# Patient Record
Sex: Male | Born: 1940 | Race: Black or African American | Hispanic: No | Marital: Married | Smoking: Former smoker
Health system: Southern US, Community
[De-identification: ages and names within clinical notes are randomized; demographics above are authoritative.]

## PROBLEM LIST (undated history)

## (undated) DIAGNOSIS — K219 Gastro-esophageal reflux disease without esophagitis: Secondary | ICD-10-CM

## (undated) DIAGNOSIS — M1990 Unspecified osteoarthritis, unspecified site: Secondary | ICD-10-CM

## (undated) DIAGNOSIS — I82409 Acute embolism and thrombosis of unspecified deep veins of unspecified lower extremity: Secondary | ICD-10-CM

## (undated) DIAGNOSIS — C801 Malignant (primary) neoplasm, unspecified: Secondary | ICD-10-CM

## (undated) DIAGNOSIS — I639 Cerebral infarction, unspecified: Secondary | ICD-10-CM

## (undated) DIAGNOSIS — I2510 Atherosclerotic heart disease of native coronary artery without angina pectoris: Secondary | ICD-10-CM

## (undated) DIAGNOSIS — I10 Essential (primary) hypertension: Secondary | ICD-10-CM

## (undated) HISTORY — DX: Gastro-esophageal reflux disease without esophagitis: K21.9

## (undated) HISTORY — DX: Acute embolism and thrombosis of unspecified deep veins of unspecified lower extremity: I82.409

## (undated) HISTORY — PX: KNEE ARTHROSCOPY: SHX127

---

## 2012-12-12 HISTORY — PX: PROSTATE BIOPSY: SHX241

## 2014-02-23 ENCOUNTER — Emergency Department (HOSPITAL_COMMUNITY)
Admission: EM | Admit: 2014-02-23 | Discharge: 2014-02-23 | Disposition: A | Discharge status: Home / Self Care | Payer: MEDICARE | Attending: Emergency Medicine | Admitting: Emergency Medicine

## 2014-02-23 ENCOUNTER — Encounter (HOSPITAL_COMMUNITY): Payer: Self-pay | Admitting: Emergency Medicine

## 2014-02-23 ENCOUNTER — Emergency Department (HOSPITAL_COMMUNITY): Payer: MEDICARE

## 2014-02-23 DIAGNOSIS — I10 Essential (primary) hypertension: Secondary | ICD-10-CM | POA: Insufficient documentation

## 2014-02-23 DIAGNOSIS — R0602 Shortness of breath: Secondary | ICD-10-CM | POA: Insufficient documentation

## 2014-02-23 DIAGNOSIS — M79609 Pain in unspecified limb: Secondary | ICD-10-CM | POA: Insufficient documentation

## 2014-02-23 DIAGNOSIS — I7102 Dissection of abdominal aorta: Secondary | ICD-10-CM

## 2014-02-23 DIAGNOSIS — Z7982 Long term (current) use of aspirin: Secondary | ICD-10-CM | POA: Insufficient documentation

## 2014-02-23 DIAGNOSIS — M129 Arthropathy, unspecified: Secondary | ICD-10-CM | POA: Insufficient documentation

## 2014-02-23 DIAGNOSIS — Z8673 Personal history of transient ischemic attack (TIA), and cerebral infarction without residual deficits: Secondary | ICD-10-CM | POA: Insufficient documentation

## 2014-02-23 DIAGNOSIS — C61 Malignant neoplasm of prostate: Secondary | ICD-10-CM

## 2014-02-23 DIAGNOSIS — Z86718 Personal history of other venous thrombosis and embolism: Secondary | ICD-10-CM | POA: Insufficient documentation

## 2014-02-23 DIAGNOSIS — Z7901 Long term (current) use of anticoagulants: Secondary | ICD-10-CM | POA: Insufficient documentation

## 2014-02-23 DIAGNOSIS — M549 Dorsalgia, unspecified: Secondary | ICD-10-CM

## 2014-02-23 DIAGNOSIS — C786 Secondary malignant neoplasm of retroperitoneum and peritoneum: Secondary | ICD-10-CM | POA: Insufficient documentation

## 2014-02-23 DIAGNOSIS — Z79899 Other long term (current) drug therapy: Secondary | ICD-10-CM | POA: Insufficient documentation

## 2014-02-23 DIAGNOSIS — I2510 Atherosclerotic heart disease of native coronary artery without angina pectoris: Secondary | ICD-10-CM | POA: Insufficient documentation

## 2014-02-23 DIAGNOSIS — Z7902 Long term (current) use of antithrombotics/antiplatelets: Secondary | ICD-10-CM | POA: Insufficient documentation

## 2014-02-23 DIAGNOSIS — R011 Cardiac murmur, unspecified: Secondary | ICD-10-CM | POA: Insufficient documentation

## 2014-02-23 DIAGNOSIS — R52 Pain, unspecified: Secondary | ICD-10-CM

## 2014-02-23 DIAGNOSIS — M79606 Pain in leg, unspecified: Secondary | ICD-10-CM

## 2014-02-23 HISTORY — DX: Atherosclerotic heart disease of native coronary artery without angina pectoris: I25.10

## 2014-02-23 HISTORY — DX: Cerebral infarction, unspecified: I63.9

## 2014-02-23 HISTORY — DX: Essential (primary) hypertension: I10

## 2014-02-23 HISTORY — DX: Malignant (primary) neoplasm, unspecified: C80.1

## 2014-02-23 HISTORY — DX: Unspecified osteoarthritis, unspecified site: M19.90

## 2014-02-23 LAB — CBC WITH DIFFERENTIAL/PLATELET
BASOS PCT: 0 % (ref 0–1)
Basophils Absolute: 0 10*3/uL (ref 0.0–0.1)
EOS ABS: 0.1 10*3/uL (ref 0.0–0.7)
Eosinophils Relative: 1 % (ref 0–5)
HCT: 33.2 % — ABNORMAL LOW (ref 39.0–52.0)
Hemoglobin: 11.1 g/dL — ABNORMAL LOW (ref 13.0–17.0)
LYMPHS ABS: 2 10*3/uL (ref 0.7–4.0)
Lymphocytes Relative: 27 % (ref 12–46)
MCH: 30.5 pg (ref 26.0–34.0)
MCHC: 33.4 g/dL (ref 30.0–36.0)
MCV: 91.2 fL (ref 78.0–100.0)
Monocytes Absolute: 0.5 10*3/uL (ref 0.1–1.0)
Monocytes Relative: 7 % (ref 3–12)
Neutro Abs: 4.7 10*3/uL (ref 1.7–7.7)
Neutrophils Relative %: 64 % (ref 43–77)
Platelets: 252 10*3/uL (ref 150–400)
RBC: 3.64 MIL/uL — ABNORMAL LOW (ref 4.22–5.81)
RDW: 12.9 % (ref 11.5–15.5)
WBC: 7.4 10*3/uL (ref 4.0–10.5)

## 2014-02-23 LAB — URINALYSIS, ROUTINE W REFLEX MICROSCOPIC
BILIRUBIN URINE: NEGATIVE
Glucose, UA: NEGATIVE mg/dL
Hgb urine dipstick: NEGATIVE
Ketones, ur: NEGATIVE mg/dL
Leukocytes, UA: NEGATIVE
Nitrite: NEGATIVE
Protein, ur: NEGATIVE mg/dL
Specific Gravity, Urine: 1.025 (ref 1.005–1.030)
Urobilinogen, UA: 0.2 mg/dL (ref 0.0–1.0)
pH: 6 (ref 5.0–8.0)

## 2014-02-23 LAB — BASIC METABOLIC PANEL
BUN: 22 mg/dL (ref 6–23)
CO2: 27 mEq/L (ref 19–32)
Calcium: 9.5 mg/dL (ref 8.4–10.5)
Chloride: 101 mEq/L (ref 96–112)
Creatinine, Ser: 1.55 mg/dL — ABNORMAL HIGH (ref 0.50–1.35)
GFR, EST AFRICAN AMERICAN: 51 mL/min — AB (ref >90)
GFR, EST NON AFRICAN AMERICAN: 44 mL/min — AB (ref >90)
GLUCOSE: 113 mg/dL — AB (ref 70–99)
POTASSIUM: 4.6 meq/L (ref 3.7–5.3)
SODIUM: 137 meq/L (ref 137–147)

## 2014-02-23 MED ORDER — IOHEXOL 350 MG/ML SOLN
100.0000 mL | Freq: Once | INTRAVENOUS | Status: AC | PRN
  Administered 2014-02-23: 100 mL via INTRAVENOUS

## 2014-02-23 MED ORDER — OXYCODONE-ACETAMINOPHEN 5-325 MG PO TABS: 1.0000 | ORAL_TABLET | Freq: Four times a day (QID) | ORAL | Status: DC | PRN

## 2014-02-23 MED ORDER — ONDANSETRON HCL 4 MG PO TABS
4.0000 mg | ORAL_TABLET | Freq: Once | ORAL | Status: AC
  Administered 2014-02-23: 4 mg via ORAL
  Filled 2014-02-23: qty 1

## 2014-02-23 MED ORDER — OXYCODONE-ACETAMINOPHEN 5-325 MG PO TABS
1.0000 | ORAL_TABLET | Freq: Once | ORAL | Status: AC
  Administered 2014-02-23: 1 via ORAL
  Filled 2014-02-23: qty 1

## 2014-02-23 NOTE — ED Provider Notes (Signed)
CSN: [id number]     Arrival date & time 02/23/14  1251 History   First MD Initiated Contact with Patient 02/23/14 1414     Chief Complaint  Patient presents with  . Back Pain     (Consider location/radiation/quality/duration/timing/severity/associated sxs/prior Treatment) HPI Comments: Patient is a 73 year old male presents to the emergency department with complaint of back pain. Patient states that for the past 2-3 weeks she's been having a pain near his right kidney area that feels like a pinch and then turns into a pain, and then goes down his right leg. The patient also complains of a heavy sensation involving the right leg. The daughter also present and is primarily the historian reports that the patient has had diagnoses of deep vein thrombosis recently. He is placed Eloquist . She states that it seems as though the healing process for his deep vein thrombosis seems to be very slow. It is also of note that the patient has a history of prostate cancer. He is currently being treated for this. 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 There's been no abdominal pain. Initially when the deep vein thrombosis was being diagnosed, there was difficulty with breathing when laying down flat, but the patient states he is not having this problem any longer. There's been no recent blood in the urine no pain in rectum or blood in the stool. His been no unusual weakness. Patient and family present to the emergency department for additional evaluation of this back pain and changes in the right leg.  The history is provided by a relative.    Past Medical History  Diagnosis Date  . Stroke   . Hypertension   . Coronary artery disease   . Arthritis   . Cancer    Past Surgical History  Procedure Laterality Date  . Knee arthroscopy     History reviewed. No pertinent family history. History   Substance Use Topics  . Smoking status: Former Research scientist (life sciences)  . Smokeless tobacco: Not on file  . Alcohol Use: No    Review of Systems  Constitutional: Positive for fatigue. Negative for activity change.       All ROS Neg except as noted in HPI  HENT: Negative for nosebleeds.   Eyes: Negative for photophobia and discharge.  Respiratory: Positive for shortness of breath. Negative for cough and wheezing.   Cardiovascular: Positive for leg swelling. Negative for chest pain and palpitations.  Gastrointestinal: Negative for abdominal pain and blood in stool.  Genitourinary: Negative for dysuria, frequency, hematuria, scrotal swelling, difficulty urinating and testicular pain.  Musculoskeletal: Positive for arthralgias and back pain. Negative for neck pain.  Skin: Negative.   Neurological: Negative for dizziness, seizures, syncope, speech difficulty and headaches.  Psychiatric/Behavioral: Negative for hallucinations and confusion.      Allergies  Claritin  Home Medications   Current Outpatient Rx  Name  Route  Sig  Dispense  Refill  . amLODipine (NORVASC) 10 MG tablet   Oral   Take 10 mg by mouth daily.         Marland Kitchen apixaban (ELIQUIS) 5 MG TABS tablet   Oral   Take 5 mg by mouth 2 (two) times daily.         Marland Kitchen aspirin EC 81 MG tablet   Oral   Take 81 mg by mouth daily.         Marland Kitchen  bicalutamide (CASODEX) 50 MG tablet   Oral   Take 50 mg by mouth daily.         . clopidogrel (PLAVIX) 75 MG tablet   Oral   Take 75 mg by mouth daily with breakfast.         . furosemide (LASIX) 20 MG tablet   Oral   Take 40 mg by mouth daily.         . hydrALAZINE (APRESOLINE) 50 MG tablet   Oral   Take 50 mg by mouth 2 (two) times daily.         Marland Kitchen leuprolide (LUPRON) 11.25 MG injection   Intramuscular   Inject 11.25 mg into the muscle every 6 (six) months.         Marland Kitchen lisinopril (PRINIVIL,ZESTRIL) 40 MG tablet   Oral   Take 40 mg by mouth daily.         . Multiple  Minerals-Vitamins (PROSTEON PO)   Oral   Take 1 tablet by mouth daily. Bone supplement         . Multiple Vitamins-Minerals (MULTIVITAMINS THER. W/MINERALS) TABS tablet   Oral   Take 1 tablet by mouth daily.         . ranitidine (ZANTAC) 150 MG tablet   Oral   Take 150 mg by mouth daily.         . simvastatin (ZOCOR) 20 MG tablet   Oral   Take 20 mg by mouth daily.          BP 160/70  Pulse 79  Temp(Src) 98.7 F (37.1 C) (Oral)  Resp 18  Ht 5\' 9"  (1.753 m)  Wt 243 lb (110.224 kg)  BMI 35.87 kg/m2  SpO2 100% Physical Exam  Nursing note and vitals reviewed. Constitutional: He is oriented to person, place, and time. He appears well-developed and well-nourished.  Non-toxic appearance.  HENT:  Head: Normocephalic.  Right Ear: Tympanic membrane and external ear normal.  Left Ear: Tympanic membrane and external ear normal.  Eyes: EOM and lids are normal. Pupils are equal, round, and reactive to light.  Neck: Normal range of motion. Neck supple. Carotid bruit is not present.  Cardiovascular: Normal rate, regular rhythm, intact distal pulses and normal pulses.   Murmur heard. Pulmonary/Chest: Breath sounds normal. No respiratory distress. He has no wheezes. He has no rales.  Abdominal: Soft. Bowel sounds are normal. He exhibits no distension. There is no tenderness. There is no guarding.  Musculoskeletal:  There is stiffness of the hip joints bilaterally and knee joints bilaterally. There is swelling from the ankle to above the knee on the right. Camera port this is not new, but seems to be slightly worse than what it had previously been. The right leg is not hot to touch. No red streaks appreciated.  There is pain to palpation in change of position involving the lower back. Particularly the right paraspinal area.  Lymphadenopathy:       Head (right side): No submandibular adenopathy present.       Head (left side): No submandibular adenopathy present.    He has no  cervical adenopathy.  Neurological: He is alert and oriented to person, place, and time. He has normal strength. No cranial nerve deficit or sensory deficit.  Skin: Skin is warm and dry.  Psychiatric: He has a normal mood and affect. His speech is normal.    ED Course  Procedures (including critical care time) [place]  MICROSCOPIC   Imaging Review No results found.   EKG Interpretation None      MDM Patient states he has some pain but does not want any medication for pain at this moment.  Complete blood count reveals a hemoglobin of 11.1, hematocrit of 33.2, otherwise well within normal limits. The basic metabolic panel shows a glucose of 113 which is slightly elevated. The creatinine is 1.55 which is elevated. This is not new according to the family. The glomerular filtration rate is 51 which is low. The urinalysis is well within normal limits.  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 There is also right retrocrural adenopathy consistent with metastatic disease.  extensive retroperitoneal retrocrural and inguinal lymphadenopathy is consistent with metastatic disease.  There is a CT angiogram abdomen and pelvis it reveals extensive retroperitoneal retrocrural and inguinal lymphadenopathy consistent with metastatic disease. Lymphoma cannot be excluded. Iliofemoral venous distention and possible occlusion secondary to the significant lymphadenopathy cannot be excluded. Multiple thoracolumbar pelvic and bilateral femoral sclerotic bone densities are consistent with blastic metastatic disease.  I discussed these extensively with the family and with the patient. The family requests the patient to be admitted. The patient's wife is already admitted to this hospital, and they are living in [place].  I discussed the case with Dr.David. The requests for  admission was denied at this time, and it was suggested that the patient very close outpatient evaluation.  Prescription for Percocet every 6 hours given for pain. Patient given the name of the hematology oncology service here at the hospital. Patient is stable at this time, however I have advised the family to return immediately if any changes, or any deterioration in the patient's condition.    Final diagnoses:  None    **I have reviewed nursing notes, vital signs, and all appropriate lab and imaging results for this patient.    Lenox Ahr, PA-C 02/23/14 [phone]

## 2014-02-23 NOTE — ED Notes (Signed)
Complain of pain in low back down to right knee

## 2014-02-23 NOTE — ED Notes (Signed)
H. Bryant, PA at bedside. 

## 2014-02-23 NOTE — Discharge Instructions (Signed)
Your CT scans reveal metastasis (spread) of your prostate cancer 2 several areas. Please see the cancer specialist listed above, or the cancer specialist of your choice for additional evaluation and management of this problem. Please use Tylenol for mild pain, use Percocet for more severe pain. This medication may cause drowsiness, as well as constipation. Please use with caution. Metastatic Cancer, Questions and Answers KEY POINTS  Cancer happens when cells become abnormal and grow without control.  Where the cancer started is called the primary cancer or the primary tumor.  Metastatic cancer happens when cancer cells spread from the place where it started to other parts of the body.  When cancer spreads, the metastatic cancer keeps the same type of cells and the same name as the primary tumor.  The most common sites of metastasis are the lungs, bones, liver, and brain.  Treatment for metastatic cancer usually depends on the type of cancer. It also depends on the size and location of the metastasis. WHAT IS CANCER?   Cancer is a group of many related diseases. All cancers begin in cells. Cells are the building blocks that make up tissues. Cancer that arises from organs and solid tissues is called a solid tumor. Cancer that begins in blood cells is called leukemia, multiple myeloma, or lymphoma.  Normally, cells grow and divide to form new cells as the body needs them. When cells grow old and die, new cells take their place. Sometimes this orderly process goes wrong. New cells form when the body does not need them. Old cells do not die when they should.  The extra cells form a mass of tissue. This is called a growth or tumor. Tumors can be either not cancerous (benign) or cancerous (malignant). Benign tumors do not spread to other parts of the body. They are rarely a threat to life. Malignant tumors can spread (metastasize) and may be life threatening. WHAT IS PRIMARY CANCER?  Cancer can begin  in any organ or tissue of the body. The original tumor is called the primary cancer or primary tumor. It is usually named for the part of the body or the type of cell in which it begins. WHAT IS METASTASIS, AND HOW DOES IT HAPPEN?   Metastasis means the spread of cancer. Cancer cells can break away from a primary tumor and enter the bloodstream or lymphatic system. This is the system that produces, stores, and carries the cells that fight infections. That is how cancer cells spread to other parts of the body.  When cancer cells spread and form a new tumor in a different organ, the new tumor is a metastatic tumor. The cells in the metastatic tumor come from the original tumor. For example, if breast cancer spreads to the lungs, the metastatic tumor in the lung is made up of cancerous breast cells. It is not made of lung cells. In this case, the disease in the lungs is metastatic breast cancer (not lung cancer). Under a microscope, metastatic breast cancer cells generally look the same as the cancer cells in the breast. [place]?   Cancer cells can spread to almost any part of the body. Cancer cells frequently spread to lymph nodes (rounded masses of lymphatic tissue) near the primary tumor (regional lymph nodes). This is called lymph node involvement or regional disease. Cancer that spreads to other organs or to lymph nodes far from the primary tumor is called metastatic disease. Caregivers sometimes also call this distant disease.  The most  common sites of metastasis from solid tumors are the lungs, bones, liver, and brain. Some cancers tend to spread to certain parts of the body. For example, lung cancer often metastasizes to the brain or bones. Colon cancer often spreads to the liver. Prostate cancer tends to spread to the bones. Breast cancer commonly spreads to the bones, lungs, liver, or brain. But each of these cancers can spread to other parts of the body as well.  Because blood  cells travel throughout the body, leukemia, multiple myeloma, and lymphoma cells are usually not localized when the cancer is diagnosed. Tumor cells may be found in the blood, several lymph nodes, or other parts of the body such as the liver or bones. This type of spread is not referred to as metastasis. ARE THERE SYMPTOMS OF METASTATIC CANCER?   Some people with metastatic cancer do not have symptoms. Their metastases are found by X-rays and other tests performed for other reasons.  When symptoms of metastatic cancer occur, the type and frequency of the symptoms will depend on the size and location of the metastasis. For example, cancer that spreads to the bones is likely to cause pain and can lead to bone fractures. Cancer that spreads to the brain can cause a variety of symptoms. These include headaches, seizures, and unsteadiness. Shortness of breath may be a sign of lung involvement. Abdominal swelling or yellowing of the skin (jaundice) can indicate that cancer has spread to the liver.  Sometimes a person's primary cancer is discovered only after the metastatic tumor causes symptoms. For example, a man whose prostate cancer has spread to the bones in his pelvis may have lower back pain (caused by the cancer in his bones) before he experiences any symptoms from the primary tumor in his prostate. HOW DOES THE CAREGIVER KNOW WHETHER A CANCER IS PRIMARY OR A METASTATIC TUMOR?  To determine whether a tumor is primary or metastatic, the tumor will be examined under a microscope. In general, cancer cells look like abnormal versions of cells in the tissue where the cancer began. Using specialized diagnostic tests, a trained person is often able to tell where the cancer cells came from. Markers or antigens found in or on the cancer cells can indicate the primary site of the cancer.  Metastatic cancers may be found before or at the same time as the primary tumor, or months or years later. When a new tumor is  found in a patient who has been treated for cancer in the past, it is more often a metastasis than another primary tumor. IS IT POSSIBLE TO HAVE A METASTATIC TUMOR WITHOUT HAVING A PRIMARY CANCER?  No. A metastatic tumor always starts from cancer cells in another part of the body. In most cases, when a metastatic tumor is found first, the primary tumor can be found. The search for the primary tumor may involve lab tests, X-rays, and other procedures. However, in a small number of cases, a metastatic tumor is diagnosed but the primary tumor cannot be found, in spite of extensive tests. The tumor is metastatic because the cells are not like those in the organ or tissue in which the tumor is found. The primary tumor is called unknown or hidden (occult). The patient is said to have cancer of unknown primary origin (CUP). Because diagnostic techniques are constantly improving, the number of cases of CUP is going down.  WHAT TREATMENTS ARE USED FOR METASTATIC CANCER?   When cancer has metastasized, it may be treated  with:  Chemotherapy.  Radiation therapy.  Biological therapy.  Hormone therapy.  Surgery.  Cryosurgery.  A combination of these.  The choice of treatment generally depends on the:  Type of primary cancer.  Size and location of the metastasis.  Patient's age and general health.  Types of treatments the patient has had in the past. In patients with CUP, it is possible to treat the disease even though the primary tumor has not been located. The goal of treatment may be to control the cancer, or to relieve symptoms or side effects of treatment. ARE NEW TREATMENTS FOR METASTATIC CANCER BEING DEVELOPED?  Yes, many new cancer treatments are under study. To develop new treatments, the [employer] sponsors clinical trials (research studies) with cancer patients in many hospitals, universities, medical schools, and cancer centers around the country. Clinical trials are a critical step in the  improvement of treatment. Before any new treatment can be recommended for general use, doctors conduct studies to find out whether the treatment is both safe for patients and effective against the disease. The results of such studies have led to progress not only in the treatment of cancer, but in the detection, diagnosis, and prevention of the disease as well. Patients interested in taking part in a clinical trial should talk with their caregivers. [place]): www.cancer.gov Document Released: 04/04/2005 Document Revised: 02/20/2012 Document Reviewed: 11/20/2008 [facility] Patient Information 2014 [place], [place].

## 2014-02-23 NOTE — ED Notes (Signed)
Pt transferred to room 10. Report given to B. Olena Heckle, RN.

## 2014-02-26 NOTE — ED Provider Notes (Signed)
Medical screening examination/treatment/procedure(s) were performed by non-physician practitioner and as supervising physician I was immediately available for consultation/collaboration.   EKG Interpretation None        Tanna Furry, MD 02/26/14 [phone]

## 2014-03-03 ENCOUNTER — Encounter (HOSPITAL_COMMUNITY): Payer: MEDICARE | Attending: Hematology and Oncology

## 2014-03-03 ENCOUNTER — Encounter (HOSPITAL_COMMUNITY): Payer: Self-pay

## 2014-03-03 ENCOUNTER — Encounter (HOSPITAL_BASED_OUTPATIENT_CLINIC_OR_DEPARTMENT_OTHER): Payer: MEDICARE

## 2014-03-03 VITALS — BP 138/61 | HR 78 | Temp 98.2°F | Resp 18 | Ht 67.5 in | Wt 236.3 lb

## 2014-03-03 DIAGNOSIS — I82411 Acute embolism and thrombosis of right femoral vein: Secondary | ICD-10-CM

## 2014-03-03 DIAGNOSIS — I639 Cerebral infarction, unspecified: Secondary | ICD-10-CM

## 2014-03-03 DIAGNOSIS — R599 Enlarged lymph nodes, unspecified: Secondary | ICD-10-CM

## 2014-03-03 DIAGNOSIS — Z87891 Personal history of nicotine dependence: Secondary | ICD-10-CM | POA: Insufficient documentation

## 2014-03-03 DIAGNOSIS — Z8673 Personal history of transient ischemic attack (TIA), and cerebral infarction without residual deficits: Secondary | ICD-10-CM | POA: Insufficient documentation

## 2014-03-03 DIAGNOSIS — M549 Dorsalgia, unspecified: Secondary | ICD-10-CM

## 2014-03-03 DIAGNOSIS — C61 Malignant neoplasm of prostate: Secondary | ICD-10-CM

## 2014-03-03 DIAGNOSIS — I10 Essential (primary) hypertension: Secondary | ICD-10-CM | POA: Insufficient documentation

## 2014-03-03 DIAGNOSIS — I2510 Atherosclerotic heart disease of native coronary artery without angina pectoris: Secondary | ICD-10-CM | POA: Insufficient documentation

## 2014-03-03 DIAGNOSIS — Z86718 Personal history of other venous thrombosis and embolism: Secondary | ICD-10-CM | POA: Insufficient documentation

## 2014-03-03 DIAGNOSIS — C7951 Secondary malignant neoplasm of bone: Secondary | ICD-10-CM

## 2014-03-03 DIAGNOSIS — C7952 Secondary malignant neoplasm of bone marrow: Secondary | ICD-10-CM

## 2014-03-03 DIAGNOSIS — I824Z9 Acute embolism and thrombosis of unspecified deep veins of unspecified distal lower extremity: Secondary | ICD-10-CM

## 2014-03-03 DIAGNOSIS — R590 Localized enlarged lymph nodes: Secondary | ICD-10-CM

## 2014-03-03 DIAGNOSIS — K219 Gastro-esophageal reflux disease without esophagitis: Secondary | ICD-10-CM | POA: Insufficient documentation

## 2014-03-03 LAB — CBC WITH DIFFERENTIAL/PLATELET
Basophils Absolute: 0 10*3/uL (ref 0.0–0.1)
Basophils Relative: 0 % (ref 0–1)
EOS ABS: 0 10*3/uL (ref 0.0–0.7)
Eosinophils Relative: 0 % (ref 0–5)
HCT: 32.6 % — ABNORMAL LOW (ref 39.0–52.0)
HEMOGLOBIN: 10.9 g/dL — AB (ref 13.0–17.0)
LYMPHS ABS: 1.1 10*3/uL (ref 0.7–4.0)
Lymphocytes Relative: 11 % — ABNORMAL LOW (ref 12–46)
MCH: 30.5 pg (ref 26.0–34.0)
MCHC: 33.4 g/dL (ref 30.0–36.0)
MCV: 91.3 fL (ref 78.0–100.0)
MONOS PCT: 8 % (ref 3–12)
Monocytes Absolute: 0.8 10*3/uL (ref 0.1–1.0)
NEUTROS ABS: 8.3 10*3/uL — AB (ref 1.7–7.7)
NEUTROS PCT: 81 % — AB (ref 43–77)
Platelets: 243 10*3/uL (ref 150–400)
RBC: 3.57 MIL/uL — AB (ref 4.22–5.81)
RDW: 13 % (ref 11.5–15.5)
WBC: 10.3 10*3/uL (ref 4.0–10.5)

## 2014-03-03 LAB — COMPREHENSIVE METABOLIC PANEL
ALBUMIN: 3.8 g/dL (ref 3.5–5.2)
ALT: 16 U/L (ref 0–53)
AST: 24 U/L (ref 0–37)
Alkaline Phosphatase: 102 U/L (ref 39–117)
BUN: 30 mg/dL — ABNORMAL HIGH (ref 6–23)
CO2: 27 mEq/L (ref 19–32)
Calcium: 9.7 mg/dL (ref 8.4–10.5)
Chloride: 96 mEq/L (ref 96–112)
Creatinine, Ser: 1.64 mg/dL — ABNORMAL HIGH (ref 0.50–1.35)
GFR calc Af Amer: 47 mL/min — ABNORMAL LOW (ref >90)
GFR calc non Af Amer: 41 mL/min — ABNORMAL LOW (ref >90)
GLUCOSE: 120 mg/dL — AB (ref 70–99)
POTASSIUM: 4.1 meq/L (ref 3.7–5.3)
SODIUM: 133 meq/L — AB (ref 137–147)
Total Bilirubin: 0.4 mg/dL (ref 0.3–1.2)
Total Protein: 8.7 g/dL — ABNORMAL HIGH (ref 6.0–8.3)

## 2014-03-03 LAB — LACTATE DEHYDROGENASE: LDH: 318 U/L — AB (ref 94–250)

## 2014-03-03 LAB — D-DIMER, QUANTITATIVE: D-Dimer, Quant: 5.84 ug/mL-FEU — ABNORMAL HIGH (ref 0.00–0.48)

## 2014-03-03 MED ORDER — OXYCODONE HCL 5 MG PO TABS: ORAL_TABLET | ORAL | Status: DC

## 2014-03-03 NOTE — Progress Notes (Signed)
[place] A. Barnet Glasgow, M.D.  NEW PATIENT EVALUATION   Name: Jacob Mueller Date: 03/04/2014 MRN: [id number] DOB: 21-Mar-1941  PCP: No primary provider on file.   REFERRING PHYSICIAN: No ref. 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    HISTORY OF PRESENT ILLNESS:Jacob Mueller is a 73 y.o. male who is referred from the emergency room for evaluation of prostate cancer with apparent bone metastases and abdominal lymphadenopathy. He was seen in the emergency room on 02/23/2014 complaining of pain in the right flank radiating down his right leg. He also complained of a heavy sensation involving the right leg at that time. He was diagnosed recently with a deep venous thrombosis in that extremity and is taking Eliquis. His 2 daughters and her friend accompanied him today. He was seen in the emergency room on 02/23/2014 with increasing right flank discomfort with radiation down the leg according to the note. Today he denies that symptomatology. He is taking Percocet with relief of discomfort. He still has persistent right lower extremity pain in which a deep venous thrombosis was diagnosed. He denies any epistaxis, hemoptysis, chest pain, PND, orthopnea, melena, hematochezia, hematuria, or incontinence. He is having difficulty walking because of the swelling of the right lower extremity. He has had bilateral gynecomastia that is not painful. He has had hot flashes. He denies a diarrhea, constipation, skin rash, headache, or seizures. Diagnosis of prostate cancer was made by biopsy about a year ago with last treatment utilizing depot Lupron, six-month variety, in January of 2015. His PSA at that time according to his daughter was 51. He had been initially over 300.   PAST MEDICAL HISTORY:  has a past medical history of Stroke; Hypertension; Coronary artery disease; Arthritis; Cancer;  Acid reflux disease; and DVT (deep venous thrombosis).     PAST SURGICAL HISTORY: Past Surgical History  Procedure Laterality Date  . Knee arthroscopy Left   . Prostate biopsy  2014     CURRENT MEDICATIONS: has a current medication list which includes the following prescription(s): amlodipine, apixaban, aspirin ec, bicalutamide, clopidogrel, doxylamine succinate (sleep), furosemide, hydralazine, leuprolide, lisinopril, multiple minerals-vitamins, multivitamins ther. w/minerals, oxycodone-acetaminophen, ranitidine, simvastatin, and oxycodone.   ALLERGIES: Claritin   SOCIAL HISTORY:  reports that he has quit smoking. He has never used smokeless tobacco. He reports that he does not drink alcohol or use illicit drugs.   FAMILY HISTORY: family history includes Diabetes in his mother.    REVIEW OF SYSTEMS:  Other than that discussed above is noncontributory.    PHYSICAL EXAM:  height is 5' 7.5" (1.715 m) and weight is 236 lb 4.8 oz (107.185 kg). His oral temperature is 98.2 F (36.8 C). His blood pressure is 138/61 and his pulse is 78. His respiration is 18.    GENERAL:alert, no distress and comfortable. Somewhat forgetful. SKIN: skin color, texture, turgor are normal, no rashes or significant lesions EYES: normal, Conjunctiva are pink and non-injected, sclera clear OROPHARYNX:no exudate, no erythema and lips, buccal mucosa, and tongue normal  NECK: supple, thyroid normal size, non-tender, without nodularity CHEST: Bilateral gynecomastia LYMPH:  no palpable lymphadenopathy in the cervical, axillary or inguinal LUNGS: clear to auscultation and percussion with normal breathing effort HEART: regular rate & rhythm and no murmurs ABDOMEN:abdomen soft, non-tender and normal bowel sounds MUSCULOSKELETALl:no cyanosis of digits, no clubbing or edema.. Right lower extremity is 3 times the size of  the left with negative straight leg raising. Homans sign is positive. NEURO: alert & oriented x  3 with fluent speech, no focal motor/sensory deficits.     LABORATORY DATA:  Office Visit on 03/03/2014  Component Date Value Ref Range Status  . WBC 03/03/2014 10.3  4.0 - 10.5 K/uL Final  . RBC 03/03/2014 3.57* 4.22 - 5.81 MIL/uL Final  . Hemoglobin 03/03/2014 10.9* 13.0 - 17.0 g/dL Final  . HCT 03/03/2014 32.6* 39.0 - 52.0 % Final  . MCV 03/03/2014 91.3  78.0 - 100.0 fL Final  . MCH 03/03/2014 30.5  26.0 - 34.0 pg Final  . MCHC 03/03/2014 33.4  30.0 - 36.0 g/dL Final  . RDW 03/03/2014 13.0  11.5 - 15.5 % Final  . Platelets 03/03/2014 243  150 - 400 K/uL Final  . Neutrophils Relative % 03/03/2014 81* 43 - 77 % Final  . Neutro Abs 03/03/2014 8.3* 1.7 - 7.7 K/uL Final  . Lymphocytes Relative 03/03/2014 11* 12 - 46 % Final  . Lymphs Abs 03/03/2014 1.1  0.7 - 4.0 K/uL Final  . Monocytes Relative 03/03/2014 8  3 - 12 % Final  . Monocytes Absolute 03/03/2014 0.8  0.1 - 1.0 K/uL Final  . Eosinophils Relative 03/03/2014 0  0 - 5 % Final  . Eosinophils Absolute 03/03/2014 0.0  0.0 - 0.7 K/uL Final  . Basophils Relative 03/03/2014 0  0 - 1 % Final  . Basophils Absolute 03/03/2014 0.0  0.0 - 0.1 K/uL Final  . Sodium 03/03/2014 133* 137 - 147 mEq/L Final  . Potassium 03/03/2014 4.1  3.7 - 5.3 mEq/L Final  . Chloride 03/03/2014 96  96 - 112 mEq/L Final  . CO2 03/03/2014 27  19 - 32 mEq/L Final  . Glucose, Bld 03/03/2014 120* 70 - 99 mg/dL Final  . BUN 03/03/2014 30* 6 - 23 mg/dL Final  . Creatinine, Ser 03/03/2014 1.64* 0.50 - 1.35 mg/dL Final  . Calcium 03/03/2014 9.7  8.4 - 10.5 mg/dL Final  . Total Protein 03/03/2014 8.7* 6.0 - 8.3 g/dL Final  . Albumin 03/03/2014 3.8  3.5 - 5.2 g/dL Final  . AST 03/03/2014 24  0 - 37 U/L Final  . ALT 03/03/2014 16  0 - 53 U/L Final  . Alkaline Phosphatase 03/03/2014 102  39 - 117 U/L Final  . Total Bilirubin 03/03/2014 0.4  0.3 - 1.2 mg/dL Final  . GFR calc non Af Amer 03/03/2014 41* >90 mL/min Final  . GFR calc Af Amer 03/03/2014 47* >90 mL/min  Final   Comment: (NOTE)                          The eGFR has been calculated using the CKD EPI equation.                          This calculation has not been validated in all clinical situations.                          eGFR's persistently <90 mL/min signify possible Chronic Kidney                          Disease.  Marland Kitchen D-Dimer, Quant 03/03/2014 5.84* 0.00 - 0.48 ug/mL-FEU Final   Comment:  AT THE INHOUSE ESTABLISHED CUTOFF                          VALUE OF 0.48 ug/mL FEU,                          THIS ASSAY HAS BEEN DOCUMENTED                          IN THE LITERATURE TO HAVE                          A SENSITIVITY AND NEGATIVE                          PREDICTIVE VALUE OF AT LEAST                          98 TO 99%.  THE TEST RESULT                          SHOULD BE CORRELATED WITH                          AN ASSESSMENT OF THE CLINICAL                          PROBABILITY OF DVT / VTE.  Marland Kitchen Testosterone 03/03/2014 <10.0* 300 - 890 ng/dL Corrected   Comment: (NOTE)                                   Tanner Stage       Male              Male                                       I              < 30 ng/dL        < 10 ng/dL                                       II             < 150 ng/dL       < 30 ng/dL                                       III            100-320 ng/dL     < 35 ng/dL                                       IV             200-970 ng/dL     15-40 ng/dL  V/Adult        300-890 ng/dL     10-70 ng/dL                          Amended report.                          Performed at [facility] 03/24 AT 9798: PREVIOUSLY REPORTED AS <10 Reference range: 300 to 890  . PSA 03/03/2014 21.04* <=4.00 ng/mL Final   Comment: (NOTE)                          Test Methodology: ECLIA PSA (Electrochemiluminescence Immunoassay)                          For PSA values from 2.5-4.0,  particularly in younger men <60 years                          old, the AUA and NCCN suggest testing for % Free PSA (3515) and                          evaluation of the rate of increase in PSA (PSA velocity).                          Performed at [facility]  . LDH 03/03/2014 318* 94 - 250 U/L Final  Admission on 02/23/2014, Discharged on 02/23/2014  Component Date Value Ref Range Status  . Color, Urine 02/23/2014 YELLOW  YELLOW Final  . APPearance 02/23/2014 CLEAR  CLEAR Final  . Specific Gravity, Urine 02/23/2014 1.025  1.005 - 1.030 Final  . pH 02/23/2014 6.0  5.0 - 8.0 Final  . Glucose, UA 02/23/2014 NEGATIVE  NEGATIVE mg/dL Final  . Hgb urine dipstick 02/23/2014 NEGATIVE  NEGATIVE Final  . Bilirubin Urine 02/23/2014 NEGATIVE  NEGATIVE Final  . Ketones, ur 02/23/2014 NEGATIVE  NEGATIVE mg/dL Final  . Protein, ur 02/23/2014 NEGATIVE  NEGATIVE mg/dL Final  . Urobilinogen, UA 02/23/2014 0.2  0.0 - 1.0 mg/dL Final  . Nitrite 02/23/2014 NEGATIVE  NEGATIVE Final  . Leukocytes, UA 02/23/2014 NEGATIVE  NEGATIVE Final   MICROSCOPIC NOT DONE ON URINES WITH NEGATIVE PROTEIN, BLOOD, LEUKOCYTES, NITRITE, OR GLUCOSE <1000 mg/dL.  . WBC 02/23/2014 7.4  4.0 - 10.5 K/uL Final  . RBC 02/23/2014 3.64* 4.22 - 5.81 MIL/uL Final  . Hemoglobin 02/23/2014 11.1* 13.0 - 17.0 g/dL Final  . HCT 02/23/2014 33.2* 39.0 - 52.0 % Final  . MCV 02/23/2014 91.2  78.0 - 100.0 fL Final  . MCH 02/23/2014 30.5  26.0 - 34.0 pg Final  . MCHC 02/23/2014 33.4  30.0 - 36.0 g/dL Final  . RDW 02/23/2014 12.9  11.5 - 15.5 % Final  . Platelets 02/23/2014 252  150 - 400 K/uL Final  . Neutrophils Relative % 02/23/2014 64  43 - 77 % Final  . Neutro Abs 02/23/2014 4.7  1.7 - 7.7 K/uL Final  . Lymphocytes Relative 02/23/2014 27  12 - 46 % Final  . Lymphs Abs 02/23/2014 2.0  0.7 - 4.0 K/uL Final  .  Monocytes Relative 02/23/2014 7  3 - 12 % Final  . Monocytes Absolute 02/23/2014 0.5  0.1 - 1.0 K/uL Final  . Eosinophils  Relative 02/23/2014 1  0 - 5 % Final  . Eosinophils Absolute 02/23/2014 0.1  0.0 - 0.7 K/uL Final  . Basophils Relative 02/23/2014 0  0 - 1 % Final  . Basophils Absolute 02/23/2014 0.0  0.0 - 0.1 K/uL Final  . Sodium 02/23/2014 137  137 - 147 mEq/L Final  . Potassium 02/23/2014 4.6  3.7 - 5.3 mEq/L Final  . Chloride 02/23/2014 101  96 - 112 mEq/L Final  . CO2 02/23/2014 27  19 - 32 mEq/L Final  . Glucose, Bld 02/23/2014 113* 70 - 99 mg/dL Final  . BUN 02/23/2014 22  6 - 23 mg/dL Final  . Creatinine, Ser 02/23/2014 1.55* 0.50 - 1.35 mg/dL Final  . Calcium 02/23/2014 9.5  8.4 - 10.5 mg/dL Final  . GFR calc non Af Amer 02/23/2014 44* >90 mL/min Final  . GFR calc Af Amer 02/23/2014 51* >90 mL/min Final   Comment: (NOTE)                          The eGFR has been calculated using the CKD EPI equation.                          This calculation has not been validated in all clinical situations.                          eGFR's persistently <90 mL/min signify possible Chronic Kidney                          Disease.    Urinalysis    Component Value Date/Time   COLORURINE YELLOW 02/23/2014 1449   APPEARANCEUR CLEAR 02/23/2014 1449   LABSPEC 1.025 02/23/2014 1449   PHURINE 6.0 02/23/2014 1449   GLUCOSEU NEGATIVE 02/23/2014 1449   HGBUR NEGATIVE 02/23/2014 1449   BILIRUBINUR NEGATIVE 02/23/2014 1449   KETONESUR NEGATIVE 02/23/2014 1449   PROTEINUR NEGATIVE 02/23/2014 1449   UROBILINOGEN 0.2 02/23/2014 1449   NITRITE NEGATIVE 02/23/2014 1449   LEUKOCYTESUR NEGATIVE 02/23/2014 1449      @RADIOGRAPHY : Ct Angio Chest Pe W/cm &/or Wo Cm  02/23/2014   CLINICAL DATA:  Pain.  Leg swelling.  EXAM: CT ANGIOGRAPHY CHEST WITH CONTRAST  TECHNIQUE: Multidetector CT imaging of the chest was performed using the standard protocol during bolus administration of intravenous contrast. Multiplanar CT image reconstructions and MIPs were obtained to evaluate the vascular anatomy.  CONTRAST:  153m OMNIPAQUE IOHEXOL 350  MG/ML SOLN  COMPARISON:  None.  FINDINGS: There are numerous blastic metastases throughout the thoracic spine and in the ribs and sternum consistent with metastatic prostate cancer. There are no pulmonary emboli. Heart size and pulmonary vascularity are normal. 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 There is a 5 mm nonspecific nodule in the right lower lobe on image number 48 of series 6. Lungs are otherwise clear except for a tiny calcification adjacent to the anterior lateral aspect of the left second rib, not significant.  There is a 17 mm right retrocrural lymph node visible on image number 70 of series 5 consistent with metastatic disease.  Review of the MIP images confirms the above findings.  IMPRESSION: 1. No pulmonary emboli.  2. Numerous osseous metastases consistent with metastatic prostate cancer. 3. Right retrocrural adenopathy consistent with metastatic disease.   Electronically Signed   By: Rozetta Nunnery M.D.   On: 02/23/2014 17:01   Ct Angio Abd/pel W/ And/or W/o  02/23/2014   CLINICAL DATA:  Back pain.  Leg swelling.  EXAM: CT ANGIOGRAPHY ABDOMEN AND PELVIS WITH CONTRAST AND WITHOUT CONTRAST  TECHNIQUE: Multidetector CT imaging of the abdomen and pelvis was performed using the standard protocol during bolus administration of intravenous contrast. Multiplanar reconstructed images and MIPs were obtained and reviewed to evaluate the vascular anatomy.  CONTRAST:  120m OMNIPAQUE IOHEXOL 350 MG/ML SOLN  COMPARISON:  CT chest 02/23/2014 .  FINDINGS: Tiny subcentimeter low-density lesions are noted throughout the right and left hepatic lobes with Hounsfield units consistent with simple cysts. Spleen normal. No focal pancreatic abnormality identified. No biliary distention. Gallbladder nondistended. No pericholecystic fluid.  Bilateral adrenal fullness noted consistent adrenal hyperplasia. Multiple bilateral renal simple cysts. Mild right hydronephrosis and hydroureter noted to  the level of the upper mid ureter. No evidence of obstructing ureteral stone. These findings are most likely secondary to extensive retroperitoneal adenopathy present.  Extensive inguinal lymphadenopathy is present, largest is in the right inguinal region and measures 1.7 cm, image 193/series 5. Extensive right iliac adenopathy with a nodal mass measuring 6.6 x 3.5 cm, image number 171/series 5. Extensive retroperitoneal lymphadenopathy lymph nodes measuring up to 3 cm. Bilateral iliofemoral venous occlusion cannot be excluded. This may be secondary to the extensive retroperitoneal adenopathy. Retrocrural adenopathy is noted with nodes measuring up to 1.7 cm, image number 28/series. Metastatic disease and/or lymphoma should be considered. The prostate is enlarged. Prostate cancer may be present. Aortoiliac atherosclerotic vascular disease. Mesenteric vessels are patent.  Appendix unremarkable. No inflammatory change in right or left lower quadrant. Stool is present throughout the colon. There is no bowel distention. No free air.  Reference made to chest CT report for chest findings. Sclerotic densities noted thoracolumbar spine, pelvis go and both proximal femurs consistent with blastic metastatic disease.  Review of the MIP images confirms the above findings.  IMPRESSION: 1. Extensive retroperitoneal, retrocrural, and inguinal lymphadenopathy consistent with metastatic disease. Lymphoma cannot be excluded. 2. Iliofemoral venous distention and possible occlusion secondary to the above-described adenopathy cannot be excluded. 3. Multiple thoracolumbar, pelvic, and bilateral femoral sclerotic bony densities consistent with blastic metastatic disease. 4. Prostate enlargement.  Prostate malignancy should be considered.   Electronically Signed   By: TMarcello Moores Register   On: 02/23/2014 17:29    PATHOLOGY: To be reviewed regarding Gleason score.   IMPRESSION:  #1. Stage IV prostate cancer with bone metastases with  abdominal lymphadenopathy, probably related, progressive. #2. Deep venous thrombosis right lower extremity with significant swelling probably secondary to retroperitoneal and pelvic lymphadenopathy causing lymphatic obstruction. #3. Hypertension, controlled. #4. Gastroesophageal reflux disease. #5. Right lower extremity pain and back pain are probably related to either bone metastases or entrapment of nerve plexus peripherally   PLAN:  #1. MRI lumbar spine to rule out epidural metastases. #2. PSA is rising in the setting of castrate levels of testosterone, alternative therapy  should probably include docetaxel while continuing LHiawathaagonist therapy plus Casodex or introduction of Xtandi. However recent data presented at [facility] 2014 (E3805)  indicated that earlier treatment with docetaxel chemotherapy together with androgen ablation results in a survival advantage of nearly 14 months versus endocrine therapy alone. Certainly more rapid cytoreduction could be accomplished for lymph node involvement which will lessen  the burden of lymphatic blockade and improve the patient's right lower extremity swelling more rapidly. #5. If epidural disease is found, radiotherapy locally may also be beneficial for palliation along with steroids. #6. This discussion was held in the presence of his 2 daughters and a friend and all agree with the strategy. In the meantime he was told to continue his current medications. #7. Followup in one week. #8. Rx Roxicodone 5 mg up to 3 every 4 hours to control pain.    Doroteo Bradford, MD 03/04/2014 6:36 AM

## 2014-03-03 NOTE — Patient Instructions (Signed)
[facility] Discharge Instructions  RECOMMENDATIONS MADE BY THE CONSULTANT AND ANY TEST RESULTS WILL BE SENT TO YOUR REFERRING PHYSICIAN.  EXAM FINDINGS BY THE PHYSICIAN TODAY AND SIGNS OR SYMPTOMS TO REPORT TO CLINIC OR PRIMARY PHYSICIAN: Exam and findings as discussed by Dr. Barnet Glasgow.  We need to check some labs today to see where you stand and also need to get a MRI of your spine.  MEDICATIONS PRESCRIBED:  none  INSTRUCTIONS/FOLLOW-UP: Follow-up 1 week.  Thank you for choosing [facility] to provide your oncology and hematology care.  To afford each patient quality time with our providers, please arrive at least 15 minutes before your scheduled appointment time.  With your help, our goal is to use those 15 minutes to complete the necessary work-up to ensure our physicians have the information they need to help with your evaluation and healthcare recommendations.    Effective January 1st, 2014, we ask that you re-schedule your appointment with our physicians should you arrive 10 or more minutes late for your appointment.  We strive to give you quality time with our providers, and arriving late affects you and other patients whose appointments are after yours.    Again, thank you for choosing [facility].  Our hope is that these requests will decrease the amount of time that you wait before being seen by our physicians.       _____________________________________________________________  Should you have questions after your visit to [facility], please contact our office at [phone] between the hours of 8:30 a.m. and 5:00 p.m.  Voicemails left after 4:30 p.m. will not be returned until the following business day.  For prescription refill requests, have your pharmacy contact our office with your prescription refill request.

## 2014-03-03 NOTE — Progress Notes (Signed)
Ward Chatters presented for labwork. Labs per MD order drawn via Peripheral Line 23 gauge needle inserted in left AC  Good blood return present. Procedure without incident.  Needle removed intact. Patient tolerated procedure well.

## 2014-03-05 ENCOUNTER — Telehealth (HOSPITAL_COMMUNITY): Payer: Self-pay | Admitting: Hematology and Oncology

## 2014-03-05 ENCOUNTER — Other Ambulatory Visit (HOSPITAL_COMMUNITY): Payer: Self-pay | Admitting: Hematology and Oncology

## 2014-03-05 DIAGNOSIS — C7951 Secondary malignant neoplasm of bone: Secondary | ICD-10-CM

## 2014-03-05 DIAGNOSIS — C61 Malignant neoplasm of prostate: Secondary | ICD-10-CM

## 2014-03-05 LAB — PSA: PSA: 21.04 ng/mL — ABNORMAL HIGH (ref <=4.00)

## 2014-03-05 LAB — BETA 2 MICROGLOBULIN, SERUM: BETA 2 MICROGLOBULIN: 2.82 mg/L — AB (ref <=2.51)

## 2014-03-05 LAB — TESTOSTERONE: Testosterone: <10 ng/dL — ABNORMAL LOW (ref 300–890)

## 2014-03-05 NOTE — Telephone Encounter (Signed)
[employer] OOS [id number] ?'D IF MRI LUMBAR W/O REQUIRED AUTH PER CSR NO AUTH IS REQUIRED CALL REF# [id number]

## 2014-03-06 ENCOUNTER — Ambulatory Visit (HOSPITAL_COMMUNITY): Admission: RE | Admit: 2014-03-06 | Payer: BC Managed Care – PPO | Source: Ambulatory Visit

## 2014-03-06 ENCOUNTER — Telehealth (HOSPITAL_COMMUNITY): Payer: Self-pay | Admitting: Hematology and Oncology

## 2014-03-06 ENCOUNTER — Ambulatory Visit (HOSPITAL_COMMUNITY)
Admission: RE | Admit: 2014-03-06 | Discharge: 2014-03-06 | Disposition: A | Discharge status: Home / Self Care | Payer: MEDICARE | Source: Ambulatory Visit | Attending: Hematology and Oncology | Admitting: Hematology and Oncology

## 2014-03-06 ENCOUNTER — Telehealth (HOSPITAL_COMMUNITY): Payer: Self-pay

## 2014-03-06 ENCOUNTER — Other Ambulatory Visit (HOSPITAL_COMMUNITY): Payer: Self-pay | Admitting: Hematology and Oncology

## 2014-03-06 ENCOUNTER — Encounter (HOSPITAL_COMMUNITY): Payer: Self-pay

## 2014-03-06 DIAGNOSIS — M47817 Spondylosis without myelopathy or radiculopathy, lumbosacral region: Secondary | ICD-10-CM | POA: Insufficient documentation

## 2014-03-06 DIAGNOSIS — C7952 Secondary malignant neoplasm of bone marrow: Secondary | ICD-10-CM

## 2014-03-06 DIAGNOSIS — M259 Joint disorder, unspecified: Secondary | ICD-10-CM | POA: Insufficient documentation

## 2014-03-06 DIAGNOSIS — C7951 Secondary malignant neoplasm of bone: Secondary | ICD-10-CM

## 2014-03-06 DIAGNOSIS — C61 Malignant neoplasm of prostate: Secondary | ICD-10-CM | POA: Insufficient documentation

## 2014-03-06 MED ORDER — DEXAMETHASONE 4 MG PO TABS: ORAL_TABLET | ORAL | Status: DC

## 2014-03-06 MED ORDER — GADOBENATE DIMEGLUMINE 529 MG/ML IV SOLN
15.0000 mL | Freq: Once | INTRAVENOUS | Status: AC | PRN
  Administered 2014-03-06: 15 mL via INTRAVENOUS

## 2014-03-06 NOTE — Telephone Encounter (Signed)
See telephone note.

## 2014-03-06 NOTE — Telephone Encounter (Signed)
Has not heard anything from [facility] re: XRT.  Requested call back at noon tomorrow if they have not heard anything.

## 2014-03-12 ENCOUNTER — Encounter (HOSPITAL_COMMUNITY): Payer: MEDICARE | Attending: Hematology and Oncology

## 2014-03-12 ENCOUNTER — Encounter (HOSPITAL_COMMUNITY): Payer: Self-pay

## 2014-03-12 VITALS — BP 104/66 | HR 55 | Temp 98.4°F | Resp 16

## 2014-03-12 DIAGNOSIS — I82411 Acute embolism and thrombosis of right femoral vein: Secondary | ICD-10-CM

## 2014-03-12 DIAGNOSIS — R599 Enlarged lymph nodes, unspecified: Secondary | ICD-10-CM

## 2014-03-12 DIAGNOSIS — N1330 Unspecified hydronephrosis: Secondary | ICD-10-CM

## 2014-03-12 DIAGNOSIS — C61 Malignant neoplasm of prostate: Secondary | ICD-10-CM | POA: Insufficient documentation

## 2014-03-12 DIAGNOSIS — Z86718 Personal history of other venous thrombosis and embolism: Secondary | ICD-10-CM

## 2014-03-12 DIAGNOSIS — R590 Localized enlarged lymph nodes: Secondary | ICD-10-CM

## 2014-03-12 DIAGNOSIS — C7951 Secondary malignant neoplasm of bone: Secondary | ICD-10-CM | POA: Insufficient documentation

## 2014-03-12 DIAGNOSIS — K5900 Constipation, unspecified: Secondary | ICD-10-CM

## 2014-03-12 DIAGNOSIS — C7952 Secondary malignant neoplasm of bone marrow: Secondary | ICD-10-CM

## 2014-03-12 NOTE — Patient Instructions (Addendum)
[facility] Discharge Instructions  RECOMMENDATIONS MADE BY THE CONSULTANT AND ANY TEST RESULTS WILL BE SENT TO YOUR REFERRING PHYSICIAN.  EXAM FINDINGS BY THE PHYSICIAN TODAY AND SIGNS OR SYMPTOMS TO REPORT TO CLINIC OR PRIMARY PHYSICIAN: Exam and findings as discussed by Dr. Barnet Glasgow.  Once you are finished with radiation we will get a port placed and start chemotherapy using Docetaxel every 3 weeks.  Will get port placement scheduled with interventional radiology in [place].  They will call you with the date and time of your appointment.  Will plan for chemotherapy teaching with Lupita Raider on 03/18/14 at 11am here in the clinic.  MEDICATIONS PRESCRIBED:  Increase Dexamethasone (Decadron) to 2 pills 4 times daily Take Senokot or equivalent 3 tablets at bedtime Stop casodex  INSTRUCTIONS/FOLLOW-UP: Will see you back in 3 weeks for chemotherapy and to see MD in follow-up.  Thank you for choosing [facility] to provide your oncology and hematology care.  To afford each patient quality time with our providers, please arrive at least 15 minutes before your scheduled appointment time.  With your help, our goal is to use those 15 minutes to complete the necessary work-up to ensure our physicians have the information they need to help with your evaluation and healthcare recommendations.    Effective January 1st, 2014, we ask that you re-schedule your appointment with our physicians should you arrive 10 or more minutes late for your appointment.  We strive to give you quality time with our providers, and arriving late affects you and other patients whose appointments are after yours.    Again, thank you for choosing [facility].  Our hope is that these requests will decrease the amount of time that you wait before being seen by our physicians.       _____________________________________________________________  Should you have questions after your visit  to [facility], please contact our office at [phone] between the hours of 8:30 a.m. and 5:00 p.m.  Voicemails left after 4:30 p.m. will not be returned until the following business day.  For prescription refill requests, have your pharmacy contact our office with your prescription refill request.     Docetaxel injection What is this medicine? DOCETAXEL (doe se TAX el) is a chemotherapy drug. It targets fast dividing cells, like cancer cells, and causes these cells to die. This medicine is used to treat many types of cancers like breast cancer, certain stomach cancers, head and neck cancer, lung cancer, and prostate cancer. This medicine may be used for other purposes; ask your health care provider or pharmacist if you have questions. COMMON BRAND NAME(S): Docefrez , Taxotere What should I tell my health care provider before I take this medicine? They need to know if you have any of these conditions: -infection (especially a virus infection such as chickenpox, cold sores, or herpes) -liver disease -low blood counts, like low white cell, platelet, or red cell counts -an unusual or allergic reaction to docetaxel, polysorbate 80, other chemotherapy agents, other medicines, foods, dyes, or preservatives -pregnant or trying to get pregnant -breast-feeding How should I use this medicine? This drug is given as an infusion into a vein. It is administered in a hospital or clinic by a specially trained health care professional. Talk to your pediatrician regarding the use of this medicine in children. Special care may be needed. Overdosage: If you think you have taken too much of this medicine contact a poison control center  or emergency room at once. NOTE: This medicine is only for you. Do not share this medicine with others. What if I miss a dose? It is important not to miss your dose. Call your doctor or health care professional if you are unable to keep an appointment. What may  interact with this medicine? -cyclosporine -erythromycin -ketoconazole -medicines to increase blood counts like filgrastim, pegfilgrastim, sargramostim -vaccines Talk to your doctor or health care professional before taking any of these medicines: -acetaminophen -aspirin -ibuprofen -ketoprofen -naproxen This list may not describe all possible interactions. Give your health care provider a list of all the medicines, herbs, non-prescription drugs, or dietary supplements you use. Also tell them if you smoke, drink alcohol, or use illegal drugs. Some items may interact with your medicine. What should I watch for while using this medicine? Your condition will be monitored carefully while you are receiving this medicine. You will need important blood work done while you are taking this medicine. This drug may make you feel generally unwell. This is not uncommon, as chemotherapy can affect healthy cells as well as cancer cells. Report any side effects. Continue your course of treatment even though you feel ill unless your doctor tells you to stop. In some cases, you may be given additional medicines to help with side effects. Follow all directions for their use. Call your doctor or health care professional for advice if you get a fever, chills or sore throat, or other symptoms of a cold or flu. Do not treat yourself. This drug decreases your body's ability to fight infections. Try to avoid being around people who are sick. This medicine may increase your risk to bruise or bleed. Call your doctor or health care professional if you notice any unusual bleeding. Be careful brushing and flossing your teeth or using a toothpick because you may get an infection or bleed more easily. If you have any dental work done, tell your dentist you are receiving this medicine. Avoid taking products that contain aspirin, acetaminophen, ibuprofen, naproxen, or ketoprofen unless instructed by your doctor. These medicines  may hide a fever. Do not become pregnant while taking this medicine. Women should inform their doctor if they wish to become pregnant or think they might be pregnant. There is a potential for serious side effects to an unborn child. Talk to your health care professional or pharmacist for more information. Do not breast-feed an infant while taking this medicine. What side effects may I notice from receiving this medicine? Side effects that you should report to your doctor or health care professional as soon as possible: -allergic reactions like skin rash, itching or hives, swelling of the face, lips, or tongue -low blood counts - This drug may decrease the number of white blood cells, red blood cells and platelets. You may be at increased risk for infections and bleeding. -signs of infection - fever or chills, cough, sore throat, pain or difficulty passing urine -signs of decreased platelets or bleeding - bruising, pinpoint red spots on the skin, black, tarry stools, nosebleeds -signs of decreased red blood cells - unusually weak or tired, fainting spells, lightheadedness -breathing problems -fast or irregular heartbeat -low blood pressure -mouth sores -nausea and vomiting -pain, swelling, redness or irritation at the injection site -pain, tingling, numbness in the hands or feet -swelling of the ankle, feet, hands -weight gain Side effects that usually do not require medical attention (report to your prescriber or health care professional if they continue or are bothersome): -bone pain -  complete hair loss including hair on your head, underarms, pubic hair, eyebrows, and eyelashes -diarrhea -excessive tearing -changes in the color of fingernails -loosening of the fingernails -nausea -muscle pain -red flush to skin -sweating -weak or tired This list may not describe all possible side effects. Call your doctor for medical advice about side effects. You may report side effects to FDA at  1-800-FDA-1088. Where should I keep my medicine? This drug is given in a hospital or clinic and will not be stored at home. NOTE: This sheet is a summary. It may not cover all possible information. If you have questions about this medicine, talk to your doctor, pharmacist, or health care provider.  2014, [employer]. (2008-11-10 11:52:10)

## 2014-03-12 NOTE — Progress Notes (Signed)
[place]  OFFICE PROGRESS NOTE  No primary provider on file. No primary provider on file.  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 Prostate cancer with bone metastases, castrate resistant  . Abdominal lymphadenopathy  . Right lower extremity deep venous thromboses    CURRENT THERAPY: Radiotherapy to the lumbar spine be done today, Casodex 50 mg twice a day, dexamethasone 8 mg twice a day, Lupron 11.25 mg intramuscularly every 6 months (last treatment in January 2015).  INTERVAL HISTORY: Jacob Mueller 73 y.o. 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  He develop numbness in the left upper extremity with weakness which dissipated over the next 12-24 hours. He is taking dexamethasone 8 mg twice a day. He has had 2 radiation treatments to his lumbar spine for epidural disease diagnosed by MRI last week. He does have hot flashes without breast pain. Right lower extremity swelling has not changed. He denies any epistaxis, melena, hematochezia, hematuria, chest pain, PND, orthopnea, or palpitations. He continues to have mild anorexia and is taking for oxycodone 5 mg tablets daily for pain relief. Overall his pain is less. He continues on Casodex 50 mg twice a day. He is scheduled for a bone scan at [facility] tomorrow.  MEDICAL HISTORY: Past Medical History  Diagnosis Date  . Stroke   . Hypertension   . Coronary artery disease   . Arthritis   . Cancer     Prostate  . Acid reflux disease   . 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   prostate cancer was made by biopsy about a year ago with last treatment utilizing depot Lupron, six-month variety, in January of 2015. His PSA at that time according to his daughter was 2. He had been initially over 300  ALLERGIES:  is allergic to claritin.  MEDICATIONS: has a current medication list which includes the following prescription(s): amlodipine, apixaban, aspirin ec, bicalutamide, clopidogrel, dexamethasone, docusate sodium, doxylamine succinate (sleep), furosemide, hydralazine, leuprolide, lisinopril, multiple minerals-vitamins, multivitamins ther. w/minerals, oxycodone, ranitidine, and simvastatin.  SURGICAL HISTORY:  Past Surgical History  Procedure Laterality Date  . Knee arthroscopy Left   . Prostate biopsy  2014    FAMILY HISTORY: family history includes Diabetes in his mother.  SOCIAL HISTORY:  reports that he has quit smoking. He has never used smokeless tobacco. He reports that he does not drink alcohol or use illicit drugs.  REVIEW OF SYSTEMS:  Other than that discussed above is noncontributory.  PHYSICAL EXAMINATION: ECOG PERFORMANCE STATUS: 2 - Symptomatic, <50% confined to bed  There were no vitals taken for this visit.  GENERAL:alert, no distress and comfortable SKIN: skin color, texture, turgor are normal, no rashes or significant lesions EYES: PERLA; Conjunctiva are pink and non-injected, sclera clear SINUSES: No redness or tenderness over maxillary or ethmoid sinuses OROPHARYNX:no exudate, no erythema on lips, buccal mucosa, or tongue. NECK: supple, thyroid normal size, non-tender, without nodularity. No masses CHEST: Increased AP diameter with bilateral gynecomastia. LYMPH:  no palpable lymphadenopathy in the cervical, axillary or inguinal  LUNGS: clear to auscultation and percussion with normal breathing effort HEART: regular rate & rhythm and no  murmurs. ABDOMEN:abdomen soft, non-tender and normal bowel sounds MUSCULOSKELETAL:no cyanosis of digits and no clubbing. Range of motion normal. Right lower extremity swelling with positive Homans sign. NEURO: alert & oriented x 3 with fluent speech, no focal motor/sensory deficits. Left upper extremity 4/5 strength versus the right.   LABORATORY DATA: Office Visit on 03/03/2014  Component Date Value Ref Range Status  . WBC 03/03/2014 10.3  4.0 - 10.5 K/uL Final  . RBC 03/03/2014 3.57* 4.22 - 5.81 MIL/uL Final  . Hemoglobin 03/03/2014 10.9* 13.0 - 17.0 g/dL Final  . HCT 03/03/2014 32.6* 39.0 - 52.0 % Final  . MCV 03/03/2014 91.3  78.0 - 100.0 fL Final  . MCH 03/03/2014 30.5  26.0 - 34.0 pg Final  . MCHC 03/03/2014 33.4  30.0 - 36.0 g/dL Final  . RDW 03/03/2014 13.0  11.5 - 15.5 % Final  . Platelets 03/03/2014 243  150 - 400 K/uL Final  . Neutrophils Relative % 03/03/2014 81* 43 - 77 % Final  . Neutro Abs 03/03/2014 8.3* 1.7 - 7.7 K/uL Final  . Lymphocytes Relative 03/03/2014 11* 12 - 46 % Final  . Lymphs Abs 03/03/2014 1.1  0.7 - 4.0 K/uL Final  . Monocytes Relative 03/03/2014 8  3 - 12 % Final  . Monocytes Absolute 03/03/2014 0.8  0.1 - 1.0 K/uL Final  . Eosinophils Relative 03/03/2014 0  0 - 5 % Final  . Eosinophils Absolute 03/03/2014 0.0  0.0 - 0.7 K/uL Final  . Basophils Relative 03/03/2014 0  0 - 1 % Final  . Basophils Absolute 03/03/2014 0.0  0.0 - 0.1 K/uL Final  . Sodium 03/03/2014 133* 137 - 147 mEq/L Final  . Potassium 03/03/2014 4.1  3.7 - 5.3 mEq/L Final  . Chloride 03/03/2014 96  96 - 112 mEq/L Final  . CO2 03/03/2014 27  19 - 32 mEq/L Final  . Glucose, Bld 03/03/2014 120* 70 - 99 mg/dL Final  . BUN 03/03/2014 30* 6 - 23 mg/dL Final  . Creatinine, Ser 03/03/2014 1.64* 0.50 - 1.35 mg/dL Final  . Calcium 03/03/2014 9.7  8.4 - 10.5 mg/dL Final  . Total Protein 03/03/2014 8.7* 6.0 - 8.3 g/dL Final  . Albumin 03/03/2014 3.8  3.5 - 5.2 g/dL Final  . AST 03/03/2014 24  0  - 37 U/L Final  . ALT 03/03/2014 16  0 - 53 U/L Final  . Alkaline Phosphatase 03/03/2014 102  39 - 117 U/L Final  . Total Bilirubin 03/03/2014 0.4  0.3 - 1.2 mg/dL Final  . GFR calc non Af Amer 03/03/2014 41* >90 mL/min Final  . GFR calc Af Amer 03/03/2014 47* >90 mL/min Final   Comment: (NOTE)                          The eGFR has been calculated using the CKD EPI equation.                          This calculation has not been validated in all clinical situations.                          eGFR's persistently <90 mL/min signify possible Chronic Kidney  Disease.  Marland Kitchen D-Dimer, Quant 03/03/2014 5.84* 0.00 - 0.48 ug/mL-FEU Final   Comment:                                 AT THE INHOUSE ESTABLISHED CUTOFF                          VALUE OF 0.48 ug/mL FEU,                          THIS ASSAY HAS BEEN DOCUMENTED                          IN THE LITERATURE TO HAVE                          A SENSITIVITY AND NEGATIVE                          PREDICTIVE VALUE OF AT LEAST                          98 TO 99%.  THE TEST RESULT                          SHOULD BE CORRELATED WITH                          AN ASSESSMENT OF THE CLINICAL                          PROBABILITY OF DVT / VTE.  Marland Kitchen Testosterone 03/03/2014 <10.0* 300 - 890 ng/dL Corrected   Comment: (NOTE)                                   Tanner Stage       Male              Male                                       I              < 30 ng/dL        < 10 ng/dL                                       II             < 150 ng/dL       < 30 ng/dL                                       III            100-320 ng/dL     < 35 ng/dL  IV             200-970 ng/dL     15-40 ng/dL                                       V/Adult        300-890 ng/dL     10-70 ng/dL                          Amended report.                          Performed at [facility]  03/25 AT 0745: PREVIOUSLY REPORTED AS <10 Reference range: 300 to 890  . PSA 03/03/2014 21.04* <=4.00 ng/mL Final   Comment: (NOTE)                          Test Methodology: ECLIA PSA (Electrochemiluminescence Immunoassay)                          For PSA values from 2.5-4.0, particularly in younger men <60 years                          old, the AUA and NCCN suggest testing for % Free PSA (3515) and                          evaluation of the rate of increase in PSA (PSA velocity).                          Performed at [facility]  . LDH 03/03/2014 318* 94 - 250 U/L Final  . Beta-2 Microglobulin 03/03/2014 2.82* <=2.51 mg/L Final   Performed at [facility]  Admission on 02/23/2014, Discharged on 02/23/2014  Component Date Value Ref Range Status  . Color, Urine 02/23/2014 YELLOW  YELLOW Final  . APPearance 02/23/2014 CLEAR  CLEAR Final  . Specific Gravity, Urine 02/23/2014 1.025  1.005 - 1.030 Final  . pH 02/23/2014 6.0  5.0 - 8.0 Final  . Glucose, UA 02/23/2014 NEGATIVE  NEGATIVE mg/dL Final  . Hgb urine dipstick 02/23/2014 NEGATIVE  NEGATIVE Final  . Bilirubin Urine 02/23/2014 NEGATIVE  NEGATIVE Final  . Ketones, ur 02/23/2014 NEGATIVE  NEGATIVE mg/dL Final  . Protein, ur 02/23/2014 NEGATIVE  NEGATIVE mg/dL Final  . Urobilinogen, UA 02/23/2014 0.2  0.0 - 1.0 mg/dL Final  . Nitrite 02/23/2014 NEGATIVE  NEGATIVE Final  . Leukocytes, UA 02/23/2014 NEGATIVE  NEGATIVE Final   MICROSCOPIC NOT DONE ON URINES WITH NEGATIVE PROTEIN, BLOOD, LEUKOCYTES, NITRITE, OR GLUCOSE <1000 mg/dL.  . WBC 02/23/2014 7.4  4.0 - 10.5 K/uL Final  . RBC 02/23/2014 3.64* 4.22 - 5.81 MIL/uL Final  . Hemoglobin 02/23/2014 11.1* 13.0 - 17.0 g/dL Final  . HCT 02/23/2014 33.2* 39.0 - 52.0 % Final  . MCV 02/23/2014 91.2  78.0 - 100.0 fL Final  . MCH 02/23/2014 30.5  26.0 - 34.0 pg Final  . MCHC 02/23/2014 33.4  30.0 - 36.0  g/dL Final  . RDW 02/23/2014 12.9  11.5 - 15.5 % Final  . Platelets  02/23/2014 252  150 - 400 K/uL Final  . Neutrophils Relative % 02/23/2014 64  43 - 77 % Final  . Neutro Abs 02/23/2014 4.7  1.7 - 7.7 K/uL Final  . Lymphocytes Relative 02/23/2014 27  12 - 46 % Final  . Lymphs Abs 02/23/2014 2.0  0.7 - 4.0 K/uL Final  . Monocytes Relative 02/23/2014 7  3 - 12 % Final  . Monocytes Absolute 02/23/2014 0.5  0.1 - 1.0 K/uL Final  . Eosinophils Relative 02/23/2014 1  0 - 5 % Final  . Eosinophils Absolute 02/23/2014 0.1  0.0 - 0.7 K/uL Final  . Basophils Relative 02/23/2014 0  0 - 1 % Final  . Basophils Absolute 02/23/2014 0.0  0.0 - 0.1 K/uL Final  . Sodium 02/23/2014 137  137 - 147 mEq/L Final  . Potassium 02/23/2014 4.6  3.7 - 5.3 mEq/L Final  . Chloride 02/23/2014 101  96 - 112 mEq/L Final  . CO2 02/23/2014 27  19 - 32 mEq/L Final  . Glucose, Bld 02/23/2014 113* 70 - 99 mg/dL Final  . BUN 02/23/2014 22  6 - 23 mg/dL Final  . Creatinine, Ser 02/23/2014 1.55* 0.50 - 1.35 mg/dL Final  . Calcium 02/23/2014 9.5  8.4 - 10.5 mg/dL Final  . GFR calc non Af Amer 02/23/2014 44* >90 mL/min Final  . GFR calc Af Amer 02/23/2014 51* >90 mL/min Final   Comment: (NOTE)                          The eGFR has been calculated using the CKD EPI equation.                          This calculation has not been validated in all clinical situations.                          eGFR's persistently <90 mL/min signify possible Chronic Kidney                          Disease.    PATHOLOGY: Gleason score of original biopsy to be reviewed.  Urinalysis    Component Value Date/Time   COLORURINE YELLOW 02/23/2014 1449   APPEARANCEUR CLEAR 02/23/2014 1449   LABSPEC 1.025 02/23/2014 1449   PHURINE 6.0 02/23/2014 1449   GLUCOSEU NEGATIVE 02/23/2014 1449   HGBUR NEGATIVE 02/23/2014 1449   BILIRUBINUR NEGATIVE 02/23/2014 1449   KETONESUR NEGATIVE 02/23/2014 1449   PROTEINUR NEGATIVE 02/23/2014 1449   UROBILINOGEN 0.2 02/23/2014 1449   NITRITE NEGATIVE 02/23/2014 1449   LEUKOCYTESUR NEGATIVE  02/23/2014 1449    RADIOGRAPHIC STUDIES: Ct Angio Chest Pe W/cm &/or Wo Cm  02/23/2014   CLINICAL DATA:  Pain.  Leg swelling.  EXAM: CT ANGIOGRAPHY CHEST WITH CONTRAST  TECHNIQUE: Multidetector CT imaging of the chest was performed using the standard protocol during bolus administration of intravenous contrast. Multiplanar CT image reconstructions and MIPs were obtained to evaluate the vascular anatomy.  CONTRAST:  159m OMNIPAQUE IOHEXOL 350 MG/ML SOLN  COMPARISON:  None.  FINDINGS: There are numerous blastic metastases throughout the thoracic spine and in the ribs and sternum consistent with metastatic prostate cancer. There are no pulmonary emboli. Heart size and pulmonary vascularity are normal. 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 There is a 5 mm nonspecific nodule in the right lower lobe on image number 48 of series 6. Lungs are otherwise clear except for a tiny calcification adjacent to the anterior lateral aspect of the left second rib, not significant.  There is a 17 mm right retrocrural lymph node visible on image number 70 of series 5 consistent with metastatic disease.  Review of the MIP images confirms the above findings.  IMPRESSION: 1. No pulmonary emboli. 2. Numerous osseous metastases consistent with metastatic prostate cancer. 3. Right retrocrural adenopathy consistent with metastatic disease.   Electronically Signed   By: Rozetta Nunnery M.D.   On: 02/23/2014 17:01   Mr Lumbar Spine W Wo Contrast  03/06/2014   CLINICAL DATA:  73 year old male with recently diagnosed prostate cancer. Pain, discomfort radiating to both lower extremities. Initial encounter.  EXAM: MRI LUMBAR SPINE WITHOUT AND WITH CONTRAST  TECHNIQUE: Multiplanar and multiecho pulse sequences of the lumbar spine were obtained without and with intravenous contrast.  CONTRAST:  23m MULTIHANCE GADOBENATE DIMEGLUMINE 529 MG/ML IV SOLN  COMPARISON:  Chest abdomen and pelvis CTs 02/23/2014.  FINDINGS:  Normal lumbar segmentation confer rimmed on the recent comparison.  Widespread osseous metastatic disease with low-density T1 signal tumor intermittently throughout the lumbar spine (significant to subtotal involvement of the L1 and L2 vertebral bodies. Lumbar posterior element involvement most noticeable at L4.  Enhancing left lateral epidural tumor resulting an mild spinal stenosis and involving the exiting left L2 nerve at the L2 vertebral body level (series 3, image 11 series 10, image 11 series 6, image 16 series 11, image 16). This epidural tumor may stem from bulky adjacent left paraspinal/psoas tumor which in turn is inseparable from bulky retroperitoneal lymphadenopathy at this level (see series 11, image 19).  There is erector spinae muscle tumor posteriorly at L3-L4 on the left. Difficult to exclude epidural tumor in the bilateral L3 neural foramina (series 11, image 25 and series 5, image 25).  No other lumbar epidural tumor identified. However, there is superimposed degenerative lumbar spinal stenosis from the L2 vertebral body level to L3-L4 which is multifactorial related to epidural lipomatosis, multilevel disc and endplate degeneration, and multilevel facet and ligament flavum hypertrophy. Superimposed on this finding is a lipoma of the filum terminale (series 6, image 26).  There is extensive bilateral sacral metastatic disease. Bulky S1 involvement laterally. Central S3 involvement. There is epidural tumor in the left S3 neural foramen (series 7, image 8). There is bulky medial left iliac bone tumor. There is bulky lower retroperitoneal and right pelvic sidewall lymphadenopathy.  Visualized lower thoracic spinal cord is normal with conus medularis at L1-L2. No abnormal intradural or visible spinal cord enhancement identified.  Right hydronephrosis and hydroureter not significantly changed.  IMPRESSION: 1. Widespread retroperitoneal, lumbosacral spine, and pelvic metastatic disease. 2. Extension of  tumor into the left lateral epidural space at the L2 level resulting in spinal stenosis and affecting the exiting left L2 nerve. Possible bilateral L3 neural foraminal epidural tumor. Left S3 sacral foraminal epidural tumor. 3. Superimposed moderate to severe degenerative lumbar spinal stenosis from L2-L3 L4. Incidental lipoma of the filum terminale. 4. No lower thoracic spinal cord or definite intradural metastatic disease. 5. Right hydronephrosis re-identified, probably due to distal right ureteral obstruction due to the bulky pelvic metastatic disease. Study discussed by telephone with Dr. GFarrel Gobbleon 03/06/2014 at 09:45 .   Electronically Signed   By: LLars PinksM.D.   On:  03/06/2014 09:54   Ct Angio Abd/pel W/ And/or W/o  02/23/2014   CLINICAL DATA:  Back pain.  Leg swelling.  EXAM: CT ANGIOGRAPHY ABDOMEN AND PELVIS WITH CONTRAST AND WITHOUT CONTRAST  TECHNIQUE: Multidetector CT imaging of the abdomen and pelvis was performed using the standard protocol during bolus administration of intravenous contrast. Multiplanar reconstructed images and MIPs were obtained and reviewed to evaluate the vascular anatomy.  CONTRAST:  117m OMNIPAQUE IOHEXOL 350 MG/ML SOLN  COMPARISON:  CT chest 02/23/2014 .  FINDINGS: Tiny subcentimeter low-density lesions are noted throughout the right and left hepatic lobes with Hounsfield units consistent with simple cysts. Spleen normal. No focal pancreatic abnormality identified. No biliary distention. Gallbladder nondistended. No pericholecystic fluid.  Bilateral adrenal fullness noted consistent adrenal hyperplasia. Multiple bilateral renal simple cysts. Mild right hydronephrosis and hydroureter noted to the level of the upper mid ureter. No evidence of obstructing ureteral stone. These findings are most likely secondary to extensive retroperitoneal adenopathy present.  Extensive inguinal lymphadenopathy is present, largest is in the right inguinal region and measures 1.7 cm,  image 193/series 5. Extensive right iliac adenopathy with a nodal mass measuring 6.6 x 3.5 cm, image number 171/series 5. Extensive retroperitoneal lymphadenopathy lymph nodes measuring up to 3 cm. Bilateral iliofemoral venous occlusion cannot be excluded. This may be secondary to the extensive retroperitoneal adenopathy. Retrocrural adenopathy is noted with nodes measuring up to 1.7 cm, image number 28/series. Metastatic disease and/or lymphoma should be considered. The prostate is enlarged. Prostate cancer may be present. Aortoiliac atherosclerotic vascular disease. Mesenteric vessels are patent.  Appendix unremarkable. No inflammatory change in right or left lower quadrant. Stool is present throughout the colon. There is no bowel distention. No free air.  Reference made to chest CT report for chest findings. Sclerotic densities noted thoracolumbar spine, pelvis go and both proximal femurs consistent with blastic metastatic disease.  Review of the MIP images confirms the above findings.  IMPRESSION: 1. Extensive retroperitoneal, retrocrural, and inguinal lymphadenopathy consistent with metastatic disease. Lymphoma cannot be excluded. 2. Iliofemoral venous distention and possible occlusion secondary to the above-described adenopathy cannot be excluded. 3. Multiple thoracolumbar, pelvic, and bilateral femoral sclerotic bony densities consistent with blastic metastatic disease. 4. Prostate enlargement.  Prostate malignancy should be considered.   Electronically Signed   By: TMarcello Moores Register   On: 02/23/2014 17:29    ASSESSMENT:  #1. 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 #2. [facility] Cas[facility]x. He has been on Casodex for a long time. #3. Hypertension, controlled. #4. Gastroesophageal reflux disease, controlled.   PLAN:  #1. Discontinue  Casodex. It may be acting as a prostate cancer promoter. #2. Increase dexamethasone to 8 mg 4 times a day. #3. Proceed with bone scan tomorrow. If thoracic spinal disease is noted, perhaps radiation fields will be expanded. #4. Senokot S to take 3 tablets at bedtime to control constipation. #5. Nurse navigator interview next week regarding docetaxel to be given every 3 weeks with Neulasta support. #[facility]. #7. Followup in 3 weeks to begin chemotherapy with docetaxel plus Neulasta support, CBC, chem profile, PSA, and office visit as well.   All questions were answered. The patient knows to call the clinic with any problems, questions or concerns. We can certainly see the patient much sooner if necessary.   I  spent 25 minutes counseling the patient face to face. The total time spent in the appointment was 30 minutes.    Doroteo Bradford, MD 03/12/2014 3:33 PM

## 2014-03-13 ENCOUNTER — Telehealth (HOSPITAL_COMMUNITY): Payer: Self-pay | Admitting: Radiology

## 2014-03-13 ENCOUNTER — Encounter (HOSPITAL_COMMUNITY): Payer: Self-pay | Admitting: *Deleted

## 2014-03-13 MED ORDER — LIDOCAINE-PRILOCAINE 2.5-2.5 % EX CREA: TOPICAL_CREAM | CUTANEOUS | Status: AC

## 2014-03-13 MED ORDER — METOCLOPRAMIDE HCL 5 MG PO TABS: ORAL_TABLET | ORAL | Status: AC

## 2014-03-13 MED ORDER — PROCHLORPERAZINE MALEATE 10 MG PO TABS: ORAL_TABLET | ORAL | Status: AC

## 2014-03-13 NOTE — Telephone Encounter (Signed)
Rec'd call from patient's daughter, Geni Bers, to make appointment for [facility].  Daughter requested date of 03/31/14 to allow pt to finish radiation treatments before pac placement.  Arrive at 0930 to Radiology at [facility], [place] after mdnt, take BP meds in am, have driver, denies blood thinners.

## 2014-03-13 NOTE — Patient Instructions (Addendum)
Jacob Mueller   CHEMOTHERAPY INSTRUCTIONS  Taxotere - bone marrow suppression (lowers white blood cells (fight infection), lowers red blood cells (make up your blood), lowers platelets (help blood to clot). This chemo can cause fluid retention. You will be responsible for taking a steroid called Dexamethasone at home prior to and after Taxotere. This steroid will keep you from having fluid retention. Take it whether you think you need it or not. Can cause hair loss, skin/nail changes (darkening of the nail beds, pain where the nail bed meets the skin, loosening of the nail beds, dry skin, palms of hands and soles of feet may darken/get sensitive/blister/peel,  nausea/vomiting, paresthesia (numbness or tingling) in extremities - we need to know if this develops, mucositis (inflammation of any mucosal membrane - the mouth, throat), mouth sores, neurotoxicity (loss of memory, headaches, trouble sleeping, etc.), can also cause excessive tear production. Please let us know if any side effect develops.  Premeds: Zofran/Dexamethasone will be given in your IV prior to you receiving chemo. This will be given to you each time prior to chemo. Zofran is a nausea med and helps reduce/prevent nausea/vomiting. Dexamethasone is a steroid. It is being given to decrease your risk of having fluid retention due to the Taxotere. It is also being given to reduce nausea/vomiting. Side Effect of Dexamethasone - can make you feel hot, flushed in the face/neck/chest, make you feel nervous/anxious, make you have trouble sleeping.   Neulasta - this medication is not chemo but being given because you have had chemo. It is usually given 20-24 hours after the completion of chemotherapy. This medication works by boosting your bone marrow's supply of white blood cells. White blood cells are what protect our bodies against infection. The medication is given in the form of a subcutaneous injection. It is given  in the fatty tissue of your abdomen. It is a short needle. The major side effect of this medication is bone or muscle pain. The drug of choice to relieve or lessen the pain is Aleve or Ibuprofen. If a physician has ever told you not to take Aleve or Ibuprofen - then don't take it. You should then take Tylenol/acetaminophen. Take either medication as the bottle directs you to.  The level of pain you experience as a result of this injection can range from none, to mild or moderate, or severe. Please let us know if you develop moderate or severe bone pain.     POTENTIAL SIDE EFFECTS OF TREATMENT: Increased Susceptibility to Infection, Vomiting, Constipation, Hair Thinning, Changes in Character of Skin and Nails (brittleness, dryness,etc.), Pigment Changes (darkening of nail beds, palms of hands, soles of feet, etc.), Bone Marrow Suppression, Abdominal Cramping, Urinary Frequency, Blood in Urine, Complete Hair Loss, Nausea, Diarrhea, Sun Sensitivity and Mouth Sores   SELF IMAGE NEEDS AND REFERRALS MADE: Obtain hair accessories as soon as possible (caps)   EDUCATIONAL MATERIALS GIVEN AND REVIEWED: Chemotherapy and You Specific Instructions Sheets:   SELF CARE ACTIVITIES WHILE ON CHEMOTHERAPY: Increase your fluid intake 48 hours prior to treatment and drink at least 2 quarts per day after treatment., No alcohol intake., No aspirin or other medications unless approved by your oncologist., Eat foods that are light and easy to digest., Eat foods at cold or room temperature., No fried, fatty, or spicy foods immediately before or after treatment., Have teeth cleaned professionally before starting treatment. Keep dentures and partial plates clean., Use soft toothbrush and do not use mouthwashes that contain  alcohol. Biotene is a good mouthwash that is available at most pharmacies or may be ordered by calling [phone]., Use warm salt water gargles (1 teaspoon salt per 1 quart warm water) before and after  meals and at bedtime. Or you may rinse with 2 tablespoons of three -percent hydrogen peroxide mixed in eight ounces of water., Always use sunscreen with SPF (Sun Protection Factor) of 30 or higher., Use your nausea medication as directed to prevent nausea., Use your stool softener or laxative as directed to prevent constipation. and Use your anti-diarrheal medication as directed to stop diarrhea.  Please wash your hands for at least 30 seconds using warm soapy water. Handwashing is the #1 way to prevent the spread of germs. Stay away from sick people or people who are getting over a cold. If you develop respiratory systems such as green/yellow mucus production or productive cough or persistent cough let us know and we will see if you need an antibiotic. It is a good idea to keep a pair of gloves on when going into grocery stores/[employer] to decrease your risk of coming into contact with germs on the carts, etc. Carry alcohol hand gel with you at all times and use it frequently if out in public. All foods need to be cooked thoroughly. No raw foods. No medium or undercooked meats, eggs. If your food is cooked medium well, it does not need to be hot pink or saturated with bloody liquid at all. Vegetables and fruits need to be washed/rinsed under the faucet with a dish detergent before being consumed. You can eat raw fruits and vegetables unless we tell you otherwise but it would be best if you cooked them or bought frozen. Do not eat off of salad bars or hot bars unless you really trust the cleanliness of the restaurant. If you need dental work, please let us know before you go for your appointment so that we can coordinate the best possible time for you in regards to your chemo regimen. You need to also let your dentist know that you are actively taking chemo. We may need to do labs prior to your dental appointment. We also want your bowels moving at least every other day. If this is not happening, we need to know so  that we can get you on a bowel regimen to help you go.     MEDICATIONS: You have been given prescriptions for the following medications:  Metoclopramide/Reglan 70m tablet. Starting the day after chemo, take 1 tablet four times a day x 48 hours. Then may take 1 tablet four times a day if needed for nausea/vomiting.   Prochlorperazine/Compazine 13mtablet. Starting the day after chemo, take 1 tablet four times a day x 48 hours. Then may take 1 tablet four times a day if needed for nausea/vomiting.   EMLA cream. Apply a quarter size amount to port site 1 hour prior to chemo. Do not rub in. Cover with plastic wrap.    Over-the-Counter Meds:  Colace - this is a stool softener. Take 10049mapsule 2-6 times a day as needed. If you have to take more than 6 capsules of Colace a day [facility] the [facility] - this is a mild laxative used to treat mild constipation. May take 2 tabs by mouth daily or up to twice a day as needed for mild constipation.  Milk of Magnesia - this is a laxative used to treat moderate to severe constipation. May take 2-4 tablespoons every  8 hours as needed. May increase to 8 tablespoons x 1 dose and if no bowel movement call the [facility].  Imodium - this is for diarrhea. Take 2 tabs after 1st loose stool and then 1 tab after each loose stool until you go a total of 12 hours without a loose stool. Call [facility] if loose stools continue.   SYMPTOMS TO REPORT AS SOON AS POSSIBLE AFTER TREATMENT:  FEVER GREATER THAN 100.5 F  CHILLS WITH OR WITHOUT FEVER  NAUSEA AND VOMITING THAT IS NOT CONTROLLED WITH YOUR NAUSEA MEDICATION  UNUSUAL SHORTNESS OF BREATH  UNUSUAL BRUISING OR BLEEDING  TENDERNESS IN MOUTH AND THROAT WITH OR WITHOUT PRESENCE OF ULCERS  URINARY PROBLEMS  BOWEL PROBLEMS  UNUSUAL RASH    Wear comfortable clothing and clothing appropriate for easy access to any Portacath or PICC line. Let us know if there is anything that we can do to  make your therapy better!      I have been informed and understand all of the instructions given to me and have received a copy. I have been instructed to call the clinic [phone] or my family physician as soon as possible for continued medical care, if indicated. I do not have any more questions at this time but understand that I may call the [facility] or the Patient Navigator at [phone] during office hours should I have questions or need assistance in obtaining follow-up care.      _________________________________________      _______________     __________ Signature of Patient or Authorized Representative        Date                            Time      _________________________________________ Nurse's Signature      Docetaxel injection What is this medicine? DOCETAXEL (doe se TAX el) is a chemotherapy drug. It targets fast dividing cells, like cancer cells, and causes these cells to die. This medicine is used to treat many types of cancers like breast cancer, certain stomach cancers, head and neck cancer, lung cancer, and prostate cancer. This medicine may be used for other purposes; ask your health care provider or pharmacist if you have questions. COMMON BRAND NAME(S): Docefrez , Taxotere What should I tell my health care provider before I take this medicine? They need to know if you have any of these conditions: -infection (especially a virus infection such as chickenpox, cold sores, or herpes) -liver disease -low blood counts, like low white cell, platelet, or red cell counts -an unusual or allergic reaction to docetaxel, polysorbate 80, other chemotherapy agents, other medicines, foods, dyes, or preservatives -pregnant or trying to get pregnant -breast-feeding How should I use this medicine? This drug is given as an infusion into a vein. It is administered in a hospital or clinic by a specially trained health care professional. Talk to your  pediatrician regarding the use of this medicine in children. Special care may be needed. Overdosage: If you think you have taken too much of this medicine contact a poison control center or emergency room at once. NOTE: This medicine is only for you. Do not share this medicine with others. What if I miss a dose? It is important not to miss your dose. Call your doctor or health care professional if you are unable to keep an appointment. What may interact with this medicine? -cyclosporine -erythromycin -ketoconazole -medicines  to increase blood counts like filgrastim, pegfilgrastim, sargramostim -vaccines Talk to your doctor or health care professional before taking any of these medicines: -acetaminophen -aspirin -ibuprofen -ketoprofen -naproxen This list may not describe all possible interactions. Give your health care provider a list of all the medicines, herbs, non-prescription drugs, or dietary supplements you use. Also tell them if you smoke, drink alcohol, or use illegal drugs. Some items may interact with your medicine. What should I watch for while using this medicine? Your condition will be monitored carefully while you are receiving this medicine. You will need important blood work done while you are taking this medicine. This drug may make you feel generally unwell. This is not uncommon, as chemotherapy can affect healthy cells as well as cancer cells. Report any side effects. Continue your course of treatment even though you feel ill unless your doctor tells you to stop. In some cases, you may be given additional medicines to help with side effects. Follow all directions for their use. Call your doctor or health care professional for advice if you get a fever, chills or sore throat, or other symptoms of a cold or flu. Do not treat yourself. This drug decreases your body's ability to fight infections. Try to avoid being around people who are sick. This medicine may increase your risk  to bruise or bleed. Call your doctor or health care professional if you notice any unusual bleeding. Be careful brushing and flossing your teeth or using a toothpick because you may get an infection or bleed more easily. If you have any dental work done, tell your dentist you are receiving this medicine. Avoid taking products that contain aspirin, acetaminophen, ibuprofen, naproxen, or ketoprofen unless instructed by your doctor. These medicines may hide a fever. Do not become pregnant while taking this medicine. Women should inform their doctor if they wish to become pregnant or think they might be pregnant. There is a potential for serious side effects to an unborn child. Talk to your health care professional or pharmacist for more information. Do not breast-feed an infant while taking this medicine. What side effects may I notice from receiving this medicine? Side effects that you should report to your doctor or health care professional as soon as possible: -allergic reactions like skin rash, itching or hives, swelling of the face, lips, or tongue -low blood counts - This drug may decrease the number of white blood cells, red blood cells and platelets. You may be at increased risk for infections and bleeding. -signs of infection - fever or chills, cough, sore throat, pain or difficulty passing urine -signs of decreased platelets or bleeding - bruising, pinpoint red spots on the skin, black, tarry stools, nosebleeds -signs of decreased red blood cells - unusually weak or tired, fainting spells, lightheadedness -breathing problems -fast or irregular heartbeat -low blood pressure -mouth sores -nausea and vomiting -pain, swelling, redness or irritation at the injection site -pain, tingling, numbness in the hands or feet -swelling of the ankle, feet, hands -weight gain Side effects that usually do not require medical attention (report to your prescriber or health care professional if they continue  or are bothersome): -bone pain -complete hair loss including hair on your head, underarms, pubic hair, eyebrows, and eyelashes -diarrhea -excessive tearing -changes in the color of fingernails -loosening of the fingernails -nausea -muscle pain -red flush to skin -sweating -weak or tired This list may not describe all possible side effects. Call your doctor for medical advice about side effects. You may  report side effects to FDA at 1-800-FDA-1088. Where should I keep my medicine? This drug is given in a hospital or clinic and will not be stored at home. NOTE: This sheet is a summary. It may not cover all possible information. If you have questions about this medicine, talk to your doctor, pharmacist, or health care provider.  2014, [employer]vier/[employer] Standard. (2008-11-10 11:52:10) Pegfilgrastim injection What is this medicine? PEGFILGRASTIM (peg fil GRA stim) helps the body make more white blood cells. It is used to prevent infection in people with low amounts of white blood cells following cancer treatment. This medicine may be used for other purposes; ask your health care provider or pharmacist if you have questions. COMMON BRAND NAME(S): Neulasta What should I tell my health care provider before I take this medicine? They need to know if you have any of these conditions: -sickle cell disease -an unusual or allergic reaction to pegfilgrastim, filgrastim, E.coli protein, other medicines, foods, dyes, or preservatives -pregnant or trying to get pregnant -breast-feeding How should I use this medicine? This medicine is for injection under the skin. It is usually given by a health care professional in a hospital or clinic setting. If you get this medicine at home, you will be taught how to prepare and give this medicine. Do not shake this medicine. Use exactly as directed. Take your medicine at regular intervals. Do not take your medicine more often than directed. It is important that you  put your used needles and syringes in a special sharps container. Do not put them in a trash can. If you do not have a sharps container, call your pharmacist or healthcare provider to get one. Talk to your pediatrician regarding the use of this medicine in children. While this drug may be prescribed for children who weigh more than 45 kg for selected conditions, precautions do apply Overdosage: If you think you have taken too much of this medicine contact a poison control center or emergency room at once. NOTE: This medicine is only for you. Do not share this medicine with others. What if I miss a dose? If you miss a dose, take it as soon as you can. If it is almost time for your next dose, take only that dose. Do not take double or extra doses. What may interact with this medicine? -lithium -medicines for growth therapy This list may not describe all possible interactions. Give your health care provider a list of all the medicines, herbs, non-prescription drugs, or dietary supplements you use. Also tell them if you smoke, drink alcohol, or use illegal drugs. Some items may interact with your medicine. What should I watch for while using this medicine? Visit your doctor for regular check ups. You will need important blood work done while you are taking this medicine. What side effects may I notice from receiving this medicine? Side effects that you should report to your doctor or health care professional as soon as possible: -allergic reactions like skin rash, itching or hives, swelling of the face, lips, or tongue -breathing problems -fever -pain, redness, or swelling where injected -shoulder pain -stomach or side pain Side effects that usually do not require medical attention (report to your doctor or health care professional if they continue or are bothersome): -aches, pains -headache -loss of appetite -nausea, vomiting -unusually tired This list may not describe all possible side  effects. Call your doctor for medical advice about side effects. You may report side effects to FDA at 1-800-FDA-1088. Where should I  keep my medicine? Keep out of the reach of children. Store in a refrigerator between 2 and 8 degrees C (36 and 46 degrees F). Do not freeze. Keep in carton to protect from light. Throw away this medicine if it is left out of the refrigerator for more than 48 hours. Throw away any unused medicine after the expiration date. NOTE: This sheet is a summary. It may not cover all possible information. If you have questions about this medicine, talk to your doctor, pharmacist, or health care provider.  2014, [employer]/Gold Standard. (2008-06-30 15:41:44) Pegfilgrastim injection What is this medicine? PEGFILGRASTIM (peg fil GRA stim) helps the body make more white blood cells. It is used to prevent infection in people with low amounts of white blood cells following cancer treatment. This medicine may be used for other purposes; ask your health care provider or pharmacist if you have questions. COMMON BRAND NAME(S): Neulasta What should I tell my health care provider before I take this medicine? They need to know if you have any of these conditions: -sickle cell disease -an unusual or allergic reaction to pegfilgrastim, filgrastim, E.coli protein, other medicines, foods, dyes, or preservatives -pregnant or trying to get pregnant -breast-feeding How should I use this medicine? This medicine is for injection under the skin. It is usually given by a health care professional in a hospital or clinic setting. If you get this medicine at home, you will be taught how to prepare and give this medicine. Do not shake this medicine. Use exactly as directed. Take your medicine at regular intervals. Do not take your medicine more often than directed. It is important that you put your used needles and syringes in a special sharps container. Do not put them in a trash can. If you do not  have a sharps container, call your pharmacist or healthcare provider to get one. Talk to your pediatrician regarding the use of this medicine in children. While this drug may be prescribed for children who weigh more than 45 kg for selected conditions, precautions do apply Overdosage: If you think you have taken too much of this medicine contact a poison control center or emergency room at once. NOTE: This medicine is only for you. Do not share this medicine with others. What if I miss a dose? If you miss a dose, take it as soon as you can. If it is almost time for your next dose, take only that dose. Do not take double or extra doses. What may interact with this medicine? -lithium -medicines for growth therapy This list may not describe all possible interactions. Give your health care provider a list of all the medicines, herbs, non-prescription drugs, or dietary supplements you use. Also tell them if you smoke, drink alcohol, or use illegal drugs. Some items may interact with your medicine. What should I watch for while using this medicine? Visit your doctor for regular check ups. You will need important blood work done while you are taking this medicine. What side effects may I notice from receiving this medicine? Side effects that you should report to your doctor or health care professional as soon as possible: -allergic reactions like skin rash, itching or hives, swelling of the face, lips, or tongue -breathing problems -fever -pain, redness, or swelling where injected -shoulder pain -stomach or side pain Side effects that usually do not require medical attention (report to your doctor or health care professional if they continue or are bothersome): -aches, pains -headache -loss of appetite -nausea, vomiting -unusually  tired This list may not describe all possible side effects. Call your doctor for medical advice about side effects. You may report side effects to FDA at  1-800-FDA-1088. Where should I keep my medicine? Keep out of the reach of children. Store in a refrigerator between 2 and 8 degrees C (36 and 46 degrees F). Do not freeze. Keep in carton to protect from light. Throw away this medicine if it is left out of the refrigerator for more than 48 hours. Throw away any unused medicine after the expiration date. NOTE: This sheet is a summary. It may not cover all possible information. If you have questions about this medicine, talk to your doctor, pharmacist, or health care provider.  2014, [employer] Standard. (2008-06-30 15:41:44) Pegfilgrastim injection What is this medicine? PEGFILGRASTIM (peg fil GRA stim) helps the body make more white blood cells. It is used to prevent infection in people with low amounts of white blood cells following cancer treatment. This medicine may be used for other purposes; ask your health care provider or pharmacist if you have questions. COMMON BRAND NAME(S): Neulasta What should I tell my health care provider before I take this medicine? They need to know if you have any of these conditions: -sickle cell disease -an unusual or allergic reaction to pegfilgrastim, filgrastim, E.coli protein, other medicines, foods, dyes, or preservatives -pregnant or trying to get pregnant -breast-feeding How should I use this medicine? This medicine is for injection under the skin. It is usually given by a health care professional in a hospital or clinic setting. If you get this medicine at home, you will be taught how to prepare and give this medicine. Do not shake this medicine. Use exactly as directed. Take your medicine at regular intervals. Do not take your medicine more often than directed. It is important that you put your used needles and syringes in a special sharps container. Do not put them in a trash can. If you do not have a sharps container, call your pharmacist or healthcare provider to get one. Talk to your  pediatrician regarding the use of this medicine in children. While this drug may be prescribed for children who weigh more than 45 kg for selected conditions, precautions do apply Overdosage: If you think you have taken too much of this medicine contact a poison control center or emergency room at once. NOTE: This medicine is only for you. Do not share this medicine with others. What if I miss a dose? If you miss a dose, take it as soon as you can. If it is almost time for your next dose, take only that dose. Do not take double or extra doses. What may interact with this medicine? -lithium -medicines for growth therapy This list may not describe all possible interactions. Give your health care provider a list of all the medicines, herbs, non-prescription drugs, or dietary supplements you use. Also tell them if you smoke, drink alcohol, or use illegal drugs. Some items may interact with your medicine. What should I watch for while using this medicine? Visit your doctor for regular check ups. You will need important blood work done while you are taking this medicine. What side effects may I notice from receiving this medicine? Side effects that you should report to your doctor or health care professional as soon as possible: -allergic reactions like skin rash, itching or hives, swelling of the face, lips, or tongue -breathing problems -fever -pain, redness, or swelling where injected -shoulder pain -stomach or side pain  Side effects that usually do not require medical attention (report to your doctor or health care professional if they continue or are bothersome): -aches, pains -headache -loss of appetite -nausea, vomiting -unusually tired This list may not describe all possible side effects. Call your doctor for medical advice about side effects. You may report side effects to FDA at 1-800-FDA-1088. Where should I keep my medicine? Keep out of the reach of children. Store in a refrigerator  between 2 and 8 degrees C (36 and 46 degrees F). Do not freeze. Keep in carton to protect from light. Throw away this medicine if it is left out of the refrigerator for more than 48 hours. Throw away any unused medicine after the expiration date. NOTE: This sheet is a summary. It may not cover all possible information. If you have questions about this medicine, talk to your doctor, pharmacist, or health care provider.  2014, Elsevier/Gold Standard. (2008-06-30 15:41:44) Metoclopramide tablets What is this medicine? METOCLOPRAMIDE (met oh kloe PRA mide) is used to treat the symptoms of gastroesophageal reflux disease (GERD) like heartburn. It is also used to treat people with slow emptying of the stomach and intestinal tract. This medicine may be used for other purposes; ask your health care provider or pharmacist if you have questions. COMMON BRAND NAME(S): Reglan What should I tell my health care provider before I take this medicine? They need to know if you have any of these conditions: -breast cancer -depression -diabetes -heart failure -high blood pressure -kidney disease -liver disease -Parkinson's disease or a movement disorder -pheochromocytoma -seizures -stomach obstruction, bleeding, or perforation -an unusual or allergic reaction to metoclopramide, procainamide, sulfites, other medicines, foods, dyes, or preservatives -pregnant or trying to get pregnant -breast-feeding How should I use this medicine? Take this medicine by mouth with a glass of water. Follow the directions on the prescription label. Take this medicine on an empty stomach, about 30 minutes before eating. Take your doses at regular intervals. Do not take your medicine more often than directed. Do not stop taking except on the advice of your doctor or health care professional. A special MedGuide will be given to you by the pharmacist with each prescription and refill. Be sure to read this information carefully each  time. Talk to your pediatrician regarding the use of this medicine in children. Special care may be needed. Overdosage: If you think you have taken too much of this medicine contact a poison control center or emergency room at once. NOTE: This medicine is only for you. Do not share this medicine with others. What if I miss a dose? If you miss a dose, take it as soon as you can. If it is almost time for your next dose, take only that dose. Do not take double or extra doses. What may interact with this medicine? -acetaminophen -cyclosporine -digoxin -medicines for blood pressure -medicines for diabetes, including insulin -medicines for hay fever and other allergies -medicines for depression, especially an Monoamine Oxidase Inhibitor (MAOI) -medicines for Parkinson's disease, like levodopa -medicines for sleep or for pain -tetracycline This list may not describe all possible interactions. Give your health care provider a list of all the medicines, herbs, non-prescription drugs, or dietary supplements you use. Also tell them if you smoke, drink alcohol, or use illegal drugs. Some items may interact with your medicine. What should I watch for while using this medicine? It may take a few weeks for your stomach condition to start to get better. However, do not take this  medicine for longer than 12 weeks. The longer you take this medicine, and the more you take it, the greater your chances are of developing serious side effects. If you are an elderly patient, a male patient, or you have diabetes, you may be at an increased risk for side effects from this medicine. Contact your doctor immediately if you start having movements you cannot control such as lip smacking, rapid movements of the tongue, involuntary or uncontrollable movements of the eyes, head, arms and legs, or muscle twitches and spasms. Patients and their families should watch out for worsening depression or thoughts of suicide. Also watch  out for any sudden or severe changes in feelings such as feeling anxious, agitated, panicky, irritable, hostile, aggressive, impulsive, severely restless, overly excited and hyperactive, or not being able to sleep. If this happens, especially at the beginning of treatment or after a change in dose, call your doctor. Do not treat yourself for high fever. Ask your doctor or health care professional for advice. You may get drowsy or dizzy. Do not drive, use machinery, or do anything that needs mental alertness until you know how this drug affects you. Do not stand or sit up quickly, especially if you are an older patient. This reduces the risk of dizzy or fainting spells. Alcohol can make you more drowsy and dizzy. Avoid alcoholic drinks. What side effects may I notice from receiving this medicine? Side effects that you should report to your doctor or health care professional as soon as possible: -allergic reactions like skin rash, itching or hives, swelling of the face, lips, or tongue -abnormal production of milk in females -breast enlargement in both males and females -change in the way you walk -difficulty moving, speaking or swallowing -drooling, lip smacking, or rapid movements of the tongue -excessive sweating -fever -involuntary or uncontrollable movements of the eyes, head, arms and legs -irregular heartbeat or palpitations -muscle twitches and spasms -unusually weak or tired Side effects that usually do not require medical attention (report to your doctor or health care professional if they continue or are bothersome): -change in sex drive or performance -depressed mood -diarrhea -difficulty sleeping -headache -menstrual changes -restless or nervous This list may not describe all possible side effects. Call your doctor for medical advice about side effects. You may report side effects to FDA at 1-800-FDA-1088. Where should I keep my medicine? Keep out of the reach of  children. Store at room temperature between 20 and 25 degrees C (68 and 77 degrees F). Protect from light. Keep container tightly closed. Throw away any unused medicine after the expiration date. NOTE: This sheet is a summary. It may not cover all possible information. If you have questions about this medicine, talk to your doctor, pharmacist, or health care provider.  2014, Elsevier/Gold Standard. (2012-03-27 13:04:38) Prochlorperazine tablets What is this medicine? PROCHLORPERAZINE (proe klor PER a zeen) helps to control severe nausea and vomiting. This medicine is also used to treat schizophrenia. It can also help patients who experience anxiety that is not due to psychological illness. This medicine may be used for other purposes; ask your health care provider or pharmacist if you have questions. COMMON BRAND NAME(S): Compazine What should I tell my health care provider before I take this medicine? They need to know if you have any of these conditions: -blood disorders or disease -dementia -liver disease or jaundice -Parkinson's disease -uncontrollable movement disorder -an unusual or allergic reaction to prochlorperazine, other medicines, foods, dyes, or preservatives -pregnant or trying  to get pregnant -breast-feeding How should I use this medicine? Take this medicine by mouth with a glass of water. Follow the directions on the prescription label. Take your doses at regular intervals. Do not take your medicine more often than directed. Do not stop taking this medicine suddenly. This can cause nausea, vomiting, and dizziness. Ask your doctor or health care professional for advice. Talk to your pediatrician regarding the use of this medicine in children. Special care may be needed. While this drug may be prescribed for children as young as 2 years for selected conditions, precautions do apply. Overdosage: If you think you have taken too much of this medicine contact a poison control center  or emergency room at once. NOTE: This medicine is only for you. Do not share this medicine with others. What if I miss a dose? If you miss a dose, take it as soon as you can. If it is almost time for your next dose, take only that dose. Do not take double or extra doses. What may interact with this medicine? Do not take this medicine with any of the following medications: -amoxapine -antidepressants like citalopram, escitalopram, fluoxetine, paroxetine, and sertraline -deferoxamine -dofetilide -maprotiline -tricyclic antidepressants like amitriptyline, clomipramine, imipramine, nortiptyline and others This medicine may also interact with the following medications: -lithium -medicines for pain -phenytoin -propranolol -warfarin This list may not describe all possible interactions. Give your health care provider a list of all the medicines, herbs, non-prescription drugs, or dietary supplements you use. Also tell them if you smoke, drink alcohol, or use illegal drugs. Some items may interact with your medicine. What should I watch for while using this medicine? Visit your doctor or health care professional for regular checks on your progress. You may get drowsy or dizzy. Do not drive, use machinery, or do anything that needs mental alertness until you know how this medicine affects you. Do not stand or sit up quickly, especially if you are an older patient. This reduces the risk of dizzy or fainting spells. Alcohol may interfere with the effect of this medicine. Avoid alcoholic drinks. This medicine can reduce the response of your body to heat or cold. Dress warm in cold weather and stay hydrated in hot weather. If possible, avoid extreme temperatures like saunas, hot tubs, very hot or cold showers, or activities that can cause dehydration such as vigorous exercise. This medicine can make you more sensitive to the sun. Keep out of the sun. If you cannot avoid being in the sun, wear protective  clothing and use sunscreen. Do not use sun lamps or tanning beds/booths. Your mouth may get dry. Chewing sugarless gum or sucking hard candy, and drinking plenty of water may help. Contact your doctor if the problem does not go away or is severe. What side effects may I notice from receiving this medicine? Side effects that you should report to your doctor or health care professional as soon as possible: -blurred vision -breast enlargement in men or women -breast milk in women who are not breast-feeding -chest pain, fast or irregular heartbeat -confusion, restlessness -dark yellow or brown urine -difficulty breathing or swallowing -dizziness or fainting spells -drooling, shaking, movement difficulty (shuffling walk) or rigidity -fever, chills, sore throat -involuntary or uncontrollable movements of the eyes, mouth, head, arms, and legs -seizures -stomach area pain -unusually weak or tired -unusual bleeding or bruising -yellowing of skin or eyes Side effects that usually do not require medical attention (report to your doctor or health care professional if  they continue or are bothersome): -difficulty passing urine -difficulty sleeping -headache -sexual dysfunction -skin rash, or itching This list may not describe all possible side effects. Call your doctor for medical advice about side effects. You may report side effects to FDA at 1-800-FDA-1088. Where should I keep my medicine? Keep out of the reach of children. Store at room temperature between 15 and 30 degrees C (59 and 86 degrees F). Protect from light. Throw away any unused medicine after the expiration date. NOTE: This sheet is a summary. It may not cover all possible information. If you have questions about this medicine, talk to your doctor, pharmacist, or health care provider.  2014, Elsevier/Gold Standard. (2012-04-17 16:59:39) Lidocaine; Prilocaine cream What is this medicine? LIDOCAINE; PRILOCAINE (LYE doe kane; PRIL  oh kane) is a topical anesthetic that causes loss of feeling in the skin and surrounding tissues. It is used to numb the skin before procedures or injections. This medicine may be used for other purposes; ask your health care provider or pharmacist if you have questions. COMMON BRAND NAME(S): EMLA What should I tell my health care provider before I take this medicine? They need to know if you have any of these conditions: -glucose-6-phosphate deficiencies -heart disease -kidney or liver disease -methemoglobinemia -an unusual or allergic reaction to lidocaine, prilocaine, other medicines, foods, dyes, or preservatives -pregnant or trying to get pregnant -breast-feeding How should I use this medicine? This medicine is for external use only on the skin. Do not take by mouth. Follow the directions on the prescription label. Wash hands before and after use. Do not use more or leave in contact with the skin longer than directed. Do not apply to eyes or open wounds. It can cause irritation and blurred or temporary loss of vision. If this medicine comes in contact with your eyes, immediately rinse the eye with water. Do not touch or rub the eye. Contact your health care provider right away. Talk to your pediatrician regarding the use of this medicine in children. While this medicine may be prescribed for children for selected conditions, precautions do apply. Overdosage: If you think you have taken too much of this medicine contact a poison control center or emergency room at once. NOTE: This medicine is only for you. Do not share this medicine with others. What if I miss a dose? This medicine is usually only applied once prior to each procedure. It must be in contact with the skin for a period of time for it to work. If you applied this medicine later than directed, tell your health care professional before starting the procedure. What may interact with this  medicine? -acetaminophen -chloroquine -dapsone -medicines to control heart rhythm -nitrates like nitroglycerin and nitroprusside -other ointments, creams, or sprays that may contain anesthetic medicine -phenobarbital -phenytoin -quinine -sulfonamides like sulfacetamide, sulfamethoxazole, sulfasalazine and others This list may not describe all possible interactions. Give your health care provider a list of all the medicines, herbs, non-prescription drugs, or dietary supplements you use. Also tell them if you smoke, drink alcohol, or use illegal drugs. Some items may interact with your medicine. What should I watch for while using this medicine? Be careful to avoid injury to the treated area while it is numb and you are not aware of pain. Avoid scratching, rubbing, or exposing the treated area to hot or cold temperatures until complete sensation has returned. The numb feeling will wear off a few hours after applying the cream. What side effects may I notice from  receiving this medicine? Side effects that you should report to your doctor or health care professional as soon as possible: -blurred vision -chest pain -difficulty breathing -dizziness -drowsiness -fast or irregular heartbeat -skin rash or itching -swelling of your throat, lips, or face -trembling Side effects that usually do not require medical attention (report to your doctor or health care professional if they continue or are bothersome): -changes in ability to feel hot or cold -redness and swelling at the application site This list may not describe all possible side effects. Call your doctor for medical advice about side effects. You may report side effects to FDA at 1-800-FDA-1088. Where should I keep my medicine? Keep out of reach of children. Store at room temperature between 15 and 30 degrees C (59 and 86 degrees F). Keep container tightly closed. Throw away any unused medicine after the expiration date. NOTE: This  sheet is a summary. It may not cover all possible information. If you have questions about this medicine, talk to your doctor, pharmacist, or health care provider.  2014, Elsevier/Gold Standard. (2008-06-02 17:14:35)

## 2014-03-17 ENCOUNTER — Other Ambulatory Visit (HOSPITAL_COMMUNITY): Payer: Self-pay | Admitting: Hematology and Oncology

## 2014-03-18 ENCOUNTER — Encounter (HOSPITAL_BASED_OUTPATIENT_CLINIC_OR_DEPARTMENT_OTHER): Payer: MEDICARE

## 2014-03-18 ENCOUNTER — Other Ambulatory Visit (HOSPITAL_COMMUNITY): Payer: Self-pay | Admitting: Hematology and Oncology

## 2014-03-18 DIAGNOSIS — C61 Malignant neoplasm of prostate: Secondary | ICD-10-CM

## 2014-03-18 MED ORDER — OXYCODONE HCL 5 MG PO TABS: ORAL_TABLET | ORAL | Status: DC

## 2014-03-18 NOTE — Progress Notes (Signed)
Chemo teaching done and consent signed for [employer]. Medication/chemo calendar given to patient. Oxycodone refill prescription given to patient. Patient instructed that he may increase his Zantac to 300mg  twice a day if needed for heartburn/reflux. Patient states that he is getting a little hoarse.

## 2014-03-21 ENCOUNTER — Telehealth (HOSPITAL_COMMUNITY): Payer: Self-pay | Admitting: *Deleted

## 2014-03-21 NOTE — Telephone Encounter (Signed)
[employer] authorization   [id number] from dates 04/03/14 to 09/30/14

## 2014-03-27 ENCOUNTER — Other Ambulatory Visit: Payer: Self-pay | Admitting: Radiology

## 2014-03-27 ENCOUNTER — Other Ambulatory Visit (HOSPITAL_COMMUNITY): Payer: Self-pay | Admitting: Hematology and Oncology

## 2014-03-28 ENCOUNTER — Encounter (HOSPITAL_COMMUNITY): Payer: Self-pay | Admitting: Pharmacy Technician

## 2014-03-31 ENCOUNTER — Ambulatory Visit (HOSPITAL_COMMUNITY)
Admission: RE | Admit: 2014-03-31 | Discharge: 2014-03-31 | Disposition: A | Discharge status: Home / Self Care | Payer: MEDICARE | Source: Ambulatory Visit | Attending: Hematology and Oncology | Admitting: Hematology and Oncology

## 2014-03-31 ENCOUNTER — Encounter (HOSPITAL_COMMUNITY): Payer: Self-pay

## 2014-03-31 ENCOUNTER — Other Ambulatory Visit (HOSPITAL_COMMUNITY): Payer: Self-pay | Admitting: Hematology and Oncology

## 2014-03-31 DIAGNOSIS — Z86718 Personal history of other venous thrombosis and embolism: Secondary | ICD-10-CM | POA: Insufficient documentation

## 2014-03-31 DIAGNOSIS — Z79899 Other long term (current) drug therapy: Secondary | ICD-10-CM | POA: Insufficient documentation

## 2014-03-31 DIAGNOSIS — C61 Malignant neoplasm of prostate: Secondary | ICD-10-CM | POA: Insufficient documentation

## 2014-03-31 DIAGNOSIS — I2510 Atherosclerotic heart disease of native coronary artery without angina pectoris: Secondary | ICD-10-CM | POA: Insufficient documentation

## 2014-03-31 DIAGNOSIS — K219 Gastro-esophageal reflux disease without esophagitis: Secondary | ICD-10-CM | POA: Insufficient documentation

## 2014-03-31 DIAGNOSIS — Z8673 Personal history of transient ischemic attack (TIA), and cerebral infarction without residual deficits: Secondary | ICD-10-CM | POA: Insufficient documentation

## 2014-03-31 DIAGNOSIS — I10 Essential (primary) hypertension: Secondary | ICD-10-CM | POA: Insufficient documentation

## 2014-03-31 LAB — CBC WITH DIFFERENTIAL/PLATELET
Basophils Absolute: 0 10*3/uL (ref 0.0–0.1)
Basophils Relative: 0 % (ref 0–1)
EOS PCT: 1 % (ref 0–5)
Eosinophils Absolute: 0 10*3/uL (ref 0.0–0.7)
HCT: 32.6 % — ABNORMAL LOW (ref 39.0–52.0)
HEMOGLOBIN: 11 g/dL — AB (ref 13.0–17.0)
LYMPHS ABS: 1.3 10*3/uL (ref 0.7–4.0)
LYMPHS PCT: 26 % (ref 12–46)
MCH: 30 pg (ref 26.0–34.0)
MCHC: 33.7 g/dL (ref 30.0–36.0)
MCV: 88.8 fL (ref 78.0–100.0)
MONO ABS: 0.5 10*3/uL (ref 0.1–1.0)
Monocytes Relative: 10 % (ref 3–12)
NEUTROS ABS: 3.1 10*3/uL (ref 1.7–7.7)
Neutrophils Relative %: 63 % (ref 43–77)
Platelets: 92 10*3/uL — ABNORMAL LOW (ref 150–400)
RBC: 3.67 MIL/uL — AB (ref 4.22–5.81)
RDW: 13.4 % (ref 11.5–15.5)
WBC: 4.9 10*3/uL (ref 4.0–10.5)

## 2014-03-31 LAB — APTT: APTT: 30 s (ref 24–37)

## 2014-03-31 LAB — PROTIME-INR
INR: 1 (ref 0.00–1.49)
Prothrombin Time: 13 seconds (ref 11.6–15.2)

## 2014-03-31 MED ORDER — MIDAZOLAM HCL 2 MG/2ML IJ SOLN
INTRAMUSCULAR | Status: AC
  Filled 2014-03-31: qty 4

## 2014-03-31 MED ORDER — FENTANYL CITRATE 0.05 MG/ML IJ SOLN
INTRAMUSCULAR | Status: AC
  Filled 2014-03-31: qty 4

## 2014-03-31 MED ORDER — LIDOCAINE HCL 1 % IJ SOLN
INTRAMUSCULAR | Status: AC
  Filled 2014-03-31: qty 20

## 2014-03-31 MED ORDER — CEFAZOLIN SODIUM-DEXTROSE 2-3 GM-% IV SOLR
2.0000 g | Freq: Once | INTRAVENOUS | Status: AC
  Administered 2014-03-31: 2 g via INTRAVENOUS
  Filled 2014-03-31: qty 50

## 2014-03-31 MED ORDER — MIDAZOLAM HCL 2 MG/2ML IJ SOLN
INTRAMUSCULAR | Status: AC | PRN
  Administered 2014-03-31 (×3): 1 mg via INTRAVENOUS

## 2014-03-31 MED ORDER — SODIUM CHLORIDE 0.9 % IV SOLN
INTRAVENOUS | Status: DC
  Administered 2014-03-31: 10:00:00 via INTRAVENOUS

## 2014-03-31 MED ORDER — FENTANYL CITRATE 0.05 MG/ML IJ SOLN
INTRAMUSCULAR | Status: AC | PRN
  Administered 2014-03-31: 100 ug via INTRAVENOUS

## 2014-03-31 MED ORDER — HEPARIN SOD (PORK) LOCK FLUSH 100 UNIT/ML IV SOLN
INTRAVENOUS | Status: AC
  Filled 2014-03-31: qty 5

## 2014-03-31 MED ORDER — HEPARIN SOD (PORK) LOCK FLUSH 100 UNIT/ML IV SOLN
500.0000 [IU] | Freq: Once | INTRAVENOUS | Status: AC
  Administered 2014-03-31: 500 [IU] via INTRAVENOUS

## 2014-03-31 NOTE — Discharge Instructions (Signed)
Implanted Port Insertion, Care After °Refer to this sheet in the next few weeks. These instructions provide you with information on caring for yourself after your procedure. Your health care provider may also give you more specific instructions. Your treatment has been planned according to current medical practices, but problems sometimes occur. Call your health care provider if you have any problems or questions after your procedure. °WHAT TO EXPECT AFTER THE PROCEDURE °After your procedure, it is typical to have the following:  °Discomfort at the port insertion site. Ice packs to the area will help. °Bruising on the skin over the port. This will subside in 3 4 days. °HOME CARE INSTRUCTIONS °After your port is placed, you will get a manufacturer's information card. The card has information about your port. Keep this card with you at all times.   °Know what kind of port you have. There are many types of ports available.   °Wear a medical alert bracelet in case of an emergency. This can help alert health care workers that you have a port.   °The port can stay in for as long as your health care provider believes it is necessary.   °A home health care nurse may give medicines and take care of the port.   °You or a family member can get special training and directions for giving medicine and taking care of the port at home.   °SEEK MEDICAL CARE IF:  °Your port does not flush or you are unable to get a blood return.    °SEEK IMMEDIATE MEDICAL CARE IF: °You have new fluid or pus coming from your incision.   °You notice a bad smell coming from your incision site.   °You have swelling, pain, or more redness at the incision or port site.   °You have a fever or chills.   °You have chest pain or shortness of breath. °Document Released: 09/18/2013 Document Reviewed: 08/05/2013 °[employer]® Patient Information ©2014 [employer]. °Moderate Sedation, Adult °Moderate sedation is given to help you relax or even sleep through a  procedure. You may remain sleepy, be clumsy, or have poor balance for several hours following this procedure. Arrange for a responsible adult, family member, or friend to take you home. A responsible adult should stay with you for at least 24 hours or until the medicines have worn off. °Do not participate in any activities where you could become injured for the next 24 hours, or until you feel normal again. Do not: °Drive. °Swim. °Ride a bicycle. °Operate heavy machinery. °Cook. °Use power tools. °Climb ladders. °Work at heights. °Do not make important decisions or sign legal documents until you are improved. °Vomiting may occur if you eat too soon. When you can drink without vomiting, try water, juice, or soup. Try solid foods if you feel little or no nausea. °Only take over-the-counter or prescription medications for pain, discomfort, or fever as directed by your caregiver.If pain medications have been prescribed for you, ask your caregiver how soon it is safe to take them. °Make sure you and your family fully understands everything about the medication given to you. Make sure you understand what side effects may occur. °You should not drink alcohol, take sleeping pills, or medications that cause drowsiness for at least 24 hours. °If you smoke, do not smoke alone. °If you are feeling better, you may resume normal activities 24 hours after receiving sedation. °Keep all appointments as scheduled. Follow all instructions. °Ask questions if you do not understand. °SEEK MEDICAL CARE IF:  °Your skin is pale or bluish in color. °  You continue to feel sick to your stomach (nauseous) or throw up (vomit). °Your pain is getting worse and not helped by medication. °You have bleeding or swelling. °You are still sleepy or feeling clumsy after 24 hours. °SEEK IMMEDIATE MEDICAL CARE IF:  °You develop a rash. °You have difficulty breathing. °You develop any type of allergic problem. °You have a fever. °Document Released: 08/23/2001  Document Revised: 02/20/2012 Document Reviewed: 08/05/2013 °[employer]® Patient Information ©2014 [employer]. ° °

## 2014-03-31 NOTE — H&P (Signed)
Chief Complaint: "I'm here for a portacath" Referring Physician:Formanek HPI: Jacob Mueller is an 73 y.o. male with metastatic prostate cancer. He is to begin chemotherapy and is scheduled for a portacath today. PMHx and meds reviewed. He reports a few months ago, he was diagnosed with a DVT and he was taken off his Plavix and started on Eliquis. He last took his Eliquis the morning of 4/18, which means he has been off for the past 48hrs, which meets our criteria.  Past Medical History:  Past Medical History  Diagnosis Date  . Stroke   . Hypertension   . Coronary artery disease   . Arthritis   . Cancer     Prostate  . Acid reflux disease   . DVT (deep venous thrombosis)     bilateral legs    Past Surgical History:  Past Surgical History  Procedure Laterality Date  . Knee arthroscopy Left   . Prostate biopsy  2014    Family History:  Family History  Problem Relation Age of Onset  . Diabetes Mother     Social History:  reports that he has quit smoking. He has never used smokeless tobacco. He reports that he does not drink alcohol or use illicit drugs.  Allergies:  Allergies  Allergen Reactions  . Claritin [Loratadine] Other (See Comments)    Lips/facial edema & hoarseness     Medications:   Medication List    ASK your doctor about these medications       amLODipine 10 MG tablet  Commonly known as:  NORVASC  Take 20 mg by mouth daily.     aspirin EC 81 MG tablet  Take 81 mg by mouth daily.     dexamethasone 4 MG tablet  Commonly known as:  DECADRON  Take 8 mg by mouth 4 (four) times daily.     docusate sodium 100 MG capsule  Commonly known as:  COLACE  Take 100 mg by mouth daily.     ELIQUIS 5 MG Tabs tablet  Generic drug:  apixaban  Take 5 mg by mouth 2 (two) times daily.     furosemide 40 MG tablet  Commonly known as:  LASIX  Take 40 mg by mouth daily.     hydrALAZINE 50 MG tablet  Commonly known as:  APRESOLINE  Take 50 mg by mouth 2 (two)  times daily.     leuprolide 11.25 MG injection  Commonly known as:  LUPRON  Inject 11.25 mg into the muscle every 6 (six) months.     lidocaine-prilocaine cream  Commonly known as:  EMLA  Apply a quarter size amount to port site 1 hour prior to chemo. Do not rub in. Cover with plastic wrap.     lisinopril 40 MG tablet  Commonly known as:  PRINIVIL,ZESTRIL  Take 40 mg by mouth daily.     metoCLOPramide 5 MG tablet  Commonly known as:  REGLAN  The day after chemo take 1 tablet four times a day x 48 hours. Then may take 1 tab four times a day if needed for nausea/vomiting.     multivitamins ther. w/minerals Tabs tablet  Take 1 tablet by mouth daily.     oxyCODONE 5 MG immediate release tablet  Commonly known as:  ROXICODONE  Take 2-3 tablets every 4 hours as needed for pain     prochlorperazine 10 MG tablet  Commonly known as:  COMPAZINE  The day after chemo take 1 tablet four times a day x 48  hours. Then may take 1 tab four times a day if needed for nausea/vomiting.     PROSTEON PO  Take 1 tablet by mouth daily. Bone supplement     ranitidine 150 MG tablet  Commonly known as:  ZANTAC  Take 150 mg by mouth 2 (two) times daily as needed for heartburn. Can take up to four times a day if needed     senna 8.6 MG tablet  Commonly known as:  SENOKOT  Take 3 tablets by mouth at bedtime.     simvastatin 20 MG tablet  Commonly known as:  ZOCOR  Take 20 mg by mouth daily.     SLEEP AID PO  Take 1 tablet by mouth at bedtime as needed.        Please HPI for pertinent positives, otherwise complete 10 system ROS negative.  Physical Exam: BP 181/80  Pulse 78  Temp(Src) 98.8 F (37.1 C) (Oral)  Resp 16  Ht 5\' 7"  (1.702 m)  Wt 236 lb (107.049 kg)  BMI 36.95 kg/m2  SpO2 100% Body mass index is 36.95 kg/(m^2).   General Appearance:  Alert, cooperative, no distress, appears stated age  Head:  Normocephalic, without obvious abnormality, atraumatic  ENT: Unremarkable  Neck:  Supple, symmetrical, trachea midline  Lungs:   Clear to auscultation bilaterally, no w/r/r, respirations unlabored without use of accessory muscles.  Chest Wall:  No tenderness or deformity  Heart:  Regular rate and rhythm, S1, S2 normal, no murmur, rub or gallop.  Abdomen:   Soft, non-tender, non distended.  Extremities: Extremities normal. 2+ edema  Neurologic: Normal affect, no gross deficits.   Results for orders placed during the hospital encounter of 03/31/14 (from the past 48 hour(s))  APTT     Status: None   Collection Time    03/31/14  9:40 AM      Result Value Ref Range   aPTT 30  24 - 37 seconds  CBC WITH DIFFERENTIAL     Status: Abnormal   Collection Time    03/31/14  9:40 AM      Result Value Ref Range   WBC 4.9  4.0 - 10.5 K/uL   RBC 3.67 (*) 4.22 - 5.81 MIL/uL   Hemoglobin 11.0 (*) 13.0 - 17.0 g/dL   HCT 32.6 (*) 39.0 - 52.0 %   MCV 88.8  78.0 - 100.0 fL   MCH 30.0  26.0 - 34.0 pg   MCHC 33.7  30.0 - 36.0 g/dL   RDW 13.4  11.5 - 15.5 %   Platelets 92 (*) 150 - 400 K/uL   Comment: SPECIMEN CHECKED FOR CLOTS     PLATELET COUNT CONFIRMED BY SMEAR   Neutrophils Relative % 63  43 - 77 %   Neutro Abs 3.1  1.7 - 7.7 K/uL   Lymphocytes Relative 26  12 - 46 %   Lymphs Abs 1.3  0.7 - 4.0 K/uL   Monocytes Relative 10  3 - 12 %   Monocytes Absolute 0.5  0.1 - 1.0 K/uL   Eosinophils Relative 1  0 - 5 %   Eosinophils Absolute 0.0  0.0 - 0.7 K/uL   Basophils Relative 0  0 - 1 %   Basophils Absolute 0.0  0.0 - 0.1 K/uL  PROTIME-INR     Status: None   Collection Time    03/31/14  9:40 AM      Result Value Ref Range   Prothrombin Time 13.0  11.6 - 15.2 seconds  INR 1.00  0.00 - 1.49   No results found.  Assessment/Plan Metastatic prostate cancer For Portacath placement today. Labs reviewed, PLTs slightly below 100k Explained procedure to pt and daughter. Risks of bleeding, infections, and effects of sedation reviewed. Consent signed in chart  Ascencion Dike  PA-C 03/31/2014, 10:26 AM

## 2014-03-31 NOTE — Procedures (Signed)
Successful RT IJ POWER PORT NO COMP STABLE TIP SVC/RA READY FOR USE

## 2014-04-03 ENCOUNTER — Encounter (HOSPITAL_COMMUNITY): Payer: Self-pay

## 2014-04-03 ENCOUNTER — Encounter (HOSPITAL_BASED_OUTPATIENT_CLINIC_OR_DEPARTMENT_OTHER): Payer: MEDICARE

## 2014-04-03 VITALS — BP 174/95 | HR 69 | Temp 97.9°F | Resp 18 | Wt 230.0 lb

## 2014-04-03 DIAGNOSIS — C7952 Secondary malignant neoplasm of bone marrow: Secondary | ICD-10-CM

## 2014-04-03 DIAGNOSIS — R232 Flushing: Secondary | ICD-10-CM

## 2014-04-03 DIAGNOSIS — C61 Malignant neoplasm of prostate: Secondary | ICD-10-CM

## 2014-04-03 DIAGNOSIS — C7951 Secondary malignant neoplasm of bone: Secondary | ICD-10-CM

## 2014-04-03 DIAGNOSIS — Z5111 Encounter for antineoplastic chemotherapy: Secondary | ICD-10-CM

## 2014-04-03 DIAGNOSIS — K5900 Constipation, unspecified: Secondary | ICD-10-CM

## 2014-04-03 DIAGNOSIS — C772 Secondary and unspecified malignant neoplasm of intra-abdominal lymph nodes: Secondary | ICD-10-CM

## 2014-04-03 DIAGNOSIS — G893 Neoplasm related pain (acute) (chronic): Secondary | ICD-10-CM

## 2014-04-03 DIAGNOSIS — R590 Localized enlarged lymph nodes: Secondary | ICD-10-CM

## 2014-04-03 DIAGNOSIS — I82411 Acute embolism and thrombosis of right femoral vein: Secondary | ICD-10-CM

## 2014-04-03 DIAGNOSIS — I8010 Phlebitis and thrombophlebitis of unspecified femoral vein: Secondary | ICD-10-CM

## 2014-04-03 LAB — CBC WITH DIFFERENTIAL/PLATELET
Basophils Absolute: 0 10*3/uL (ref 0.0–0.1)
Basophils Relative: 0 % (ref 0–1)
EOS PCT: 1 % (ref 0–5)
Eosinophils Absolute: 0.1 10*3/uL (ref 0.0–0.7)
HCT: 31.8 % — ABNORMAL LOW (ref 39.0–52.0)
HEMOGLOBIN: 10.8 g/dL — AB (ref 13.0–17.0)
LYMPHS ABS: 1.4 10*3/uL (ref 0.7–4.0)
Lymphocytes Relative: 25 % (ref 12–46)
MCH: 30.3 pg (ref 26.0–34.0)
MCHC: 34 g/dL (ref 30.0–36.0)
MCV: 89.1 fL (ref 78.0–100.0)
Monocytes Absolute: 0.7 10*3/uL (ref 0.1–1.0)
Monocytes Relative: 12 % (ref 3–12)
Neutro Abs: 3.3 10*3/uL (ref 1.7–7.7)
Neutrophils Relative %: 61 % (ref 43–77)
PLATELETS: 105 10*3/uL — AB (ref 150–400)
RBC: 3.57 MIL/uL — AB (ref 4.22–5.81)
RDW: 13.3 % (ref 11.5–15.5)
WBC: 5.4 10*3/uL (ref 4.0–10.5)

## 2014-04-03 LAB — COMPREHENSIVE METABOLIC PANEL
ALK PHOS: 87 U/L (ref 39–117)
ALT: 15 U/L (ref 0–53)
AST: 23 U/L (ref 0–37)
Albumin: 3.5 g/dL (ref 3.5–5.2)
BUN: 17 mg/dL (ref 6–23)
CALCIUM: 9.4 mg/dL (ref 8.4–10.5)
CO2: 27 meq/L (ref 19–32)
Chloride: 100 mEq/L (ref 96–112)
Creatinine, Ser: 0.99 mg/dL (ref 0.50–1.35)
GFR, EST NON AFRICAN AMERICAN: 81 mL/min — AB (ref >90)
Glucose, Bld: 174 mg/dL — ABNORMAL HIGH (ref 70–99)
POTASSIUM: 3.6 meq/L — AB (ref 3.7–5.3)
Sodium: 138 mEq/L (ref 137–147)
Total Bilirubin: 0.5 mg/dL (ref 0.3–1.2)
Total Protein: 7.2 g/dL (ref 6.0–8.3)

## 2014-04-03 MED ORDER — SODIUM CHLORIDE 0.9 % IV SOLN
Freq: Once | INTRAVENOUS | Status: AC
  Administered 2014-04-03: 8 mg via INTRAVENOUS
  Filled 2014-04-03: qty 4

## 2014-04-03 MED ORDER — SODIUM CHLORIDE 0.9 % IV SOLN
Freq: Once | INTRAVENOUS | Status: AC
  Administered 2014-04-03: 10:00:00 via INTRAVENOUS

## 2014-04-03 MED ORDER — HEPARIN SOD (PORK) LOCK FLUSH 100 UNIT/ML IV SOLN
500.0000 [IU] | Freq: Once | INTRAVENOUS | Status: AC | PRN
  Administered 2014-04-03: 500 [IU]
  Filled 2014-04-03: qty 5

## 2014-04-03 MED ORDER — SODIUM CHLORIDE 0.9 % IV SOLN: 8.0000 mg | Freq: Once | INTRAVENOUS | Status: DC

## 2014-04-03 MED ORDER — DEXAMETHASONE SODIUM PHOSPHATE 10 MG/ML IJ SOLN: 10.0000 mg | Freq: Once | INTRAMUSCULAR | Status: DC

## 2014-04-03 MED ORDER — OXYCODONE HCL 5 MG PO TABS: ORAL_TABLET | ORAL | Status: DC

## 2014-04-03 MED ORDER — DOCETAXEL CHEMO INJECTION 160 MG/16ML
75.0000 mg/m2 | Freq: Once | INTRAVENOUS | Status: AC
  Administered 2014-04-03: 170 mg via INTRAVENOUS
  Filled 2014-04-03: qty 17

## 2014-04-03 MED ORDER — SODIUM CHLORIDE 0.9 % IJ SOLN
10.0000 mL | INTRAMUSCULAR | Status: DC | PRN
  Administered 2014-04-03: 10 mL

## 2014-04-03 NOTE — Progress Notes (Signed)
[place]  OFFICE PROGRESS NOTE  No primary provider on file. No primary provider on file.  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 Carcinoma of the prostate with bone and lymph node metastase  . Deep venous thrombosis right lower extremity    CURRENT THERAPY: Radiotherapy to the lumbar spine. Dexamethasone 8 mg 4 times a day. Depo-Lupron 11.25 mg intramuscularly every 6 months with Casodex added 08/06/2013 with last treatment 01/30/2014. Discontinuation of Casodex on 03/12/2014. Eliquis 5 mg twice a day. Oxycodone.  INTERVAL HISTORY: Jacob Mueller 73 y.o. 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  Right lower extremity swelling is decreased. Pain is still a problem. Patient is not taking cathartics on a regular basis however. He is chronically constipated with episodes of propulsive loose diarrhea. He continues on eliquis 5 mg twice a day and denies epistaxis, melena, hematochezia, hematuria, or hemoptysis. Appetite is fair. He denies any skin rash, headache, or lower extremity paresthesias. Radiation was completed on 03/24/2014 having received 10 treatments.  MEDICAL HISTORY: Past Medical History  Diagnosis Date  . Stroke   . Hypertension   . Coronary artery disease   . Arthritis   . Cancer     Prostate  . Acid reflux disease   . 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   Prostate adenocarcinoma Gleason's  4+3+7 diagnosed in January 2014, PSA 322, with bone metastases(01/21/2013) started on Depo-Lupron 01/29/2013 with Casodex added on 08/08/2013 with drop in PSA to 70 on 03/26/2013, 24 on 10/01/2013.  PSA on 01/30/2014 was 18.48  Developed RLE swelling with pain and diagnosed in [place] with DVT, currently on Eliquis 58m BID.  Lumbar spine MRI showed epidural disease treated with RT for 10 [place]  Casodex discontinued as a systemic therapeutic maneuver 03/12/2014. Chemotherapy with Docetaxel started 04/03/2014.  ALLERGIES:  is allergic to claritin.  MEDICATIONS: has a current medication list which includes the following prescription(s): amlodipine, apixaban, aspirin ec, dexamethasone, docusate sodium, doxylamine succinate (sleep), furosemide, hydralazine, leuprolide, lidocaine-prilocaine, lisinopril, metoclopramide, multiple minerals-vitamins, multivitamins ther. w/minerals, oxycodone, prochlorperazine, ranitidine, senna, and simvastatin, and the following Facility-Administered Medications: heparin lock flush and sodium chloride.  SURGICAL HISTORY:  Past Surgical History  Procedure Laterality Date  . Knee arthroscopy Left   . Prostate biopsy  2014    FAMILY HISTORY: family history includes Diabetes in his mother.  SOCIAL HISTORY:  reports that he has quit smoking. He has never used smokeless tobacco. He reports that he does not drink alcohol or use illicit drugs.  REVIEW OF SYSTEMS:  Other than that discussed above is noncontributory.  PHYSICAL EXAMINATION: ECOG PERFORMANCE STATUS: 2 - Symptomatic, <50% confined to bed  There were no vitals taken for this visit.  GENERAL:alert, no distress and comfortable SKIN: skin color, texture, turgor are normal, no rashes or significant lesions EYES: PERLA; Conjunctiva are pink and non-injected, sclera clear SINUSES: No redness or tenderness over maxillary or ethmoid sinuses OROPHARYNX:no exudate, no erythema on lips, buccal  mucosa,  or tongue. NECK: supple, thyroid normal size, non-tender, without nodularity. No masses CHEST: Increased AP diameter with bilateral gynecomastia. Right anterior chest life port in place. LYMPH:  no palpable lymphadenopathy in the cervical, axillary or inguinal LUNGS: clear to auscultation and percussion with normal breathing effort HEART: regular rate & rhythm and no murmurs. ABDOMEN:abdomen soft, non-tender and normal bowel sounds MUSCULOSKELETAL:no cyanosis of digits and no clubbing. Range of motion normal. Right lower extremity swelling with positive Homans sign. NEURO: alert & oriented x 3 with fluent speech, no focal motor/sensory deficits. Positive straight leg raising on the right.   LABORATORY DATA: Infusion on 04/03/2014  Component Date Value Ref Range Status  . WBC 04/03/2014 5.4  4.0 - 10.5 K/uL Final  . RBC 04/03/2014 3.57* 4.22 - 5.81 MIL/uL Final  . Hemoglobin 04/03/2014 10.8* 13.0 - 17.0 g/dL Final  . HCT 04/03/2014 31.8* 39.0 - 52.0 % Final  . MCV 04/03/2014 89.1  78.0 - 100.0 fL Final  . MCH 04/03/2014 30.3  26.0 - 34.0 pg Final  . MCHC 04/03/2014 34.0  30.0 - 36.0 g/dL Final  . RDW 04/03/2014 13.3  11.5 - 15.5 % Final  . Platelets 04/03/2014 105* 150 - 400 K/uL Final   Comment: SPECIMEN CHECKED FOR CLOTS                          PLATELET COUNT CONFIRMED BY SMEAR  . Neutrophils Relative % 04/03/2014 61  43 - 77 % Final  . Neutro Abs 04/03/2014 3.3  1.7 - 7.7 K/uL Final  . Lymphocytes Relative 04/03/2014 25  12 - 46 % Final  . Lymphs Abs 04/03/2014 1.4  0.7 - 4.0 K/uL Final  . Monocytes Relative 04/03/2014 12  3 - 12 % Final  . Monocytes Absolute 04/03/2014 0.7  0.1 - 1.0 K/uL Final  . Eosinophils Relative 04/03/2014 1  0 - 5 % Final  . Eosinophils Absolute 04/03/2014 0.1  0.0 - 0.7 K/uL Final  . Basophils Relative 04/03/2014 0  0 - 1 % Final  . Basophils Absolute 04/03/2014 0.0  0.0 - 0.1 K/uL Final  . Sodium 04/03/2014 138  137 - 147 mEq/L Final  .  Potassium 04/03/2014 3.6* 3.7 - 5.3 mEq/L Final  . Chloride 04/03/2014 100  96 - 112 mEq/L Final  . CO2 04/03/2014 27  19 - 32 mEq/L Final  . Glucose, Bld 04/03/2014 174* 70 - 99 mg/dL Final  . BUN 04/03/2014 17  6 - 23 mg/dL Final  . Creatinine, Ser 04/03/2014 0.99  0.50 - 1.35 mg/dL Final  . Calcium 04/03/2014 9.4  8.4 - 10.5 mg/dL Final  . Total Protein 04/03/2014 7.2  6.0 - 8.3 g/dL Final  . Albumin 04/03/2014 3.5  3.5 - 5.2 g/dL Final  . AST 04/03/2014 23  0 - 37 U/L Final  . ALT 04/03/2014 15  0 - 53 U/L Final  . Alkaline Phosphatase 04/03/2014 87  39 - 117 U/L Final  . Total Bilirubin 04/03/2014 0.5  0.3 - 1.2 mg/dL Final  . GFR calc non Af Amer 04/03/2014 81* >90 mL/min Final  . GFR calc Af Amer 04/03/2014 >90  >90 mL/min Final   Comment: (NOTE)                          The eGFR has been calculated using the CKD EPI equation.  This calculation has not been validated in all clinical situations.                          eGFR's persistently <90 mL/min signify possible Chronic Kidney                          Disease.  Hospital Outpatient Visit on 03/31/2014  Component Date Value Ref Range Status  . aPTT 03/31/2014 30  24 - 37 seconds Final  . WBC 03/31/2014 4.9  4.0 - 10.5 K/uL Final  . RBC 03/31/2014 3.67* 4.22 - 5.81 MIL/uL Final  . Hemoglobin 03/31/2014 11.0* 13.0 - 17.0 g/dL Final  . HCT 03/31/2014 32.6* 39.0 - 52.0 % Final  . MCV 03/31/2014 88.8  78.0 - 100.0 fL Final  . MCH 03/31/2014 30.0  26.0 - 34.0 pg Final  . MCHC 03/31/2014 33.7  30.0 - 36.0 g/dL Final  . RDW 03/31/2014 13.4  11.5 - 15.5 % Final  . Platelets 03/31/2014 92* 150 - 400 K/uL Final   Comment: SPECIMEN CHECKED FOR CLOTS                          PLATELET COUNT CONFIRMED BY SMEAR  . Neutrophils Relative % 03/31/2014 63  43 - 77 % Final  . Neutro Abs 03/31/2014 3.1  1.7 - 7.7 K/uL Final  . Lymphocytes Relative 03/31/2014 26  12 - 46 % Final  . Lymphs Abs 03/31/2014 1.3  0.7 - 4.0  K/uL Final  . Monocytes Relative 03/31/2014 10  3 - 12 % Final  . Monocytes Absolute 03/31/2014 0.5  0.1 - 1.0 K/uL Final  . Eosinophils Relative 03/31/2014 1  0 - 5 % Final  . Eosinophils Absolute 03/31/2014 0.0  0.0 - 0.7 K/uL Final  . Basophils Relative 03/31/2014 0  0 - 1 % Final  . Basophils Absolute 03/31/2014 0.0  0.0 - 0.1 K/uL Final  . Prothrombin Time 03/31/2014 13.0  11.6 - 15.2 seconds Final  . INR 03/31/2014 1.00  0.00 - 1.49 Final    PATHOLOGY: Gleason"s 4+3=7  Prostate biopsy via ultrasound January 2014 Dr. Clower  Urinalysis    Component Value Date/Time   COLORURINE YELLOW 02/23/2014 [place] 02/23/2014 1449   LABSPEC 1.025 02/23/2014 1449   PHURINE 6.0 02/23/2014 1449   GLUCOSEU NEGATIVE 02/23/2014 1449   HGBUR NEGATIVE 02/23/2014 1449   [place] 02/23/2014 1449   [place] 02/23/2014 1449   PROTEINUR NEGATIVE 02/23/2014 1449   UROBILINOGEN 0.2 02/23/2014 1449   NITRITE NEGATIVE 02/23/2014 1449   [place] 02/23/2014 1449    RADIOGRAPHIC STUDIES:  03/13/2014: Bone Scan([place])  "Multiple areas of increased activity are identified throughout the thoracic spine and lumbar spine as well as the pelvic bones particularly in the iliac bone on the left and the sacrum on the left. IMPRESSION: Multiple areas of increased activity identified consistent with metastatic disease. These are most prominent in the left iliac bone similar to that seen on recent CT. Dense nephrogram in the right kidney consistent with the obstructive change seen on prior CT."   Mr Lumbar Spine W Wo Contrast  03/06/2014   CLINICAL DATA:  73 year old male with recently diagnosed prostate cancer. Pain, discomfort radiating to both lower extremities. Initial encounter.  EXAM: MRI LUMBAR SPINE WITHOUT AND WITH CONTRAST  TECHNIQUE: Multiplanar and multiecho pulse sequences of the lumbar  spine were obtained without and with intravenous contrast.  CONTRAST:  55m  MULTIHANCE GADOBENATE DIMEGLUMINE 529 MG/ML IV SOLN  COMPARISON:  Chest abdomen and pelvis CTs 02/23/2014.  FINDINGS: Normal lumbar segmentation confer rimmed on the recent comparison.  Widespread osseous metastatic disease with low-density T1 signal tumor intermittently throughout the lumbar spine (significant to subtotal involvement of the L1 and L2 vertebral bodies. Lumbar posterior element involvement most noticeable at L4.  Enhancing left lateral epidural tumor resulting an mild spinal stenosis and involving the exiting left L2 nerve at the L2 vertebral body level (series 3, image 11 series 10, image 11 series 6, image 16 series 11, image 16). This epidural tumor may stem from bulky adjacent left paraspinal/psoas tumor which in turn is inseparable from bulky retroperitoneal lymphadenopathy at this level (see series 11, image 19).  There is erector spinae muscle tumor posteriorly at L3-L4 on the left. Difficult to exclude epidural tumor in the bilateral L3 neural foramina (series 11, image 25 and series 5, image 25).  No other lumbar epidural tumor identified. However, there is superimposed degenerative lumbar spinal stenosis from the L2 vertebral body level to L3-L4 which is multifactorial related to epidural lipomatosis, multilevel disc and endplate degeneration, and multilevel facet and ligament flavum hypertrophy. Superimposed on this finding is a lipoma of the filum terminale (series 6, image 26).  There is extensive bilateral sacral metastatic disease. Bulky S1 involvement laterally. Central S3 involvement. There is epidural tumor in the left S3 neural foramen (series 7, image 8). There is bulky medial left iliac bone tumor. There is bulky lower retroperitoneal and right pelvic sidewall lymphadenopathy.  Visualized lower thoracic spinal cord is normal with conus medularis at L1-L2. No abnormal intradural or visible spinal cord enhancement identified.  Right hydronephrosis and hydroureter not significantly  changed.  IMPRESSION: 1. Widespread retroperitoneal, lumbosacral spine, and pelvic metastatic disease. 2. Extension of tumor into the left lateral epidural space at the L2 level resulting in spinal stenosis and affecting the exiting left L2 nerve. Possible bilateral L3 neural foraminal epidural tumor. Left S3 sacral foraminal epidural tumor. 3. Superimposed moderate to severe degenerative lumbar spinal stenosis from L2-L3 L4. Incidental lipoma of the filum terminale. 4. No lower thoracic spinal cord or definite intradural metastatic disease. 5. Right hydronephrosis re-identified, probably due to distal right ureteral obstruction due to the bulky pelvic metastatic disease. Study discussed by telephone with Dr. GFarrel Gobbleon 03/06/2014 at 09:45 .   Electronically Signed   By: LLars PinksM.D.   On: 03/06/2014 09:54   Ir Fluoro Guide Cv Line Right  03/31/2014   CLINICAL DATA:  Metastatic prostate cancer  EXAM: RIGHT INTERNAL JUGULAR SINGLE LUMEN POWER PORT CATHETER INSERTION  Date:  4/20/20154/20/2015 1:33 PM  Radiologist:  MJerilynn Mages TDaryll Brod MD  Guidance:  ULTRASOUND AND FLUOROSCOPIC  FLUOROSCOPY TIME:  1 MIN  MEDICATIONS AND MEDICAL HISTORY: 2 g Ancefadministered within 1 hour of the procedure.3 mg Versed, 100 mcg fentanyl  ANESTHESIA/SEDATION: 30 min  CONTRAST:  None.  COMPLICATIONS: No immediate  PROCEDURE: Informed consent was obtained from the patient following explanation of the procedure, risks, benefits and alternatives. The patient understands, agrees and consents for the procedure. All questions were addressed. A time out was performed.  Maximal barrier sterile technique utilized including caps, mask, sterile gowns, sterile gloves, large sterile drape, hand hygiene, and 2% chlorhexidine scrub.  Under sterile conditions and local anesthesia, right internal jugular micropuncture venous access was performed. Access was performed with ultrasound. Images were  obtained for documentation. A guide wire was  inserted followed by a transitional dilator. This allowed insertion of a guide wire and catheter into the IVC. Measurements were obtained from the SVC / RA junction back to the right IJ venotomy site. In the right infraclavicular chest, a subcutaneous pocket was created over the second anterior rib. This was done under sterile conditions and local anesthesia. 1% lidocaine with epinephrine was utilized for this. A 2.5 cm incision was made in the skin. Blunt dissection was performed to create a subcutaneous pocket over the right pectoralis major muscle. The pocket was flushed with saline vigorously. There was adequate hemostasis. The port catheter was assembled and checked for leakage. The port catheter was secured in the pocket with two retention sutures. The tubing was tunneled subcutaneously to the Right venotomy site and inserted into the SVC/RA junction through a valved peel-away sheath. Position was confirmed with fluoroscopy. Images were obtained for documentation. The patient tolerated the procedure well. No immediate complications. Incisions were closed in a two layer fashion with 4 - 0 Vicryl suture. Dermabond was applied to the skin. The port catheter was accessed, blood was aspirated followed by saline and heparin flushes. Needle was removed. A dry sterile dressing was applied.  IMPRESSION: Ultrasound and fluoroscopically guided right internal jugular single lumen power port catheter insertion. Tip in the SVC/RA junction. Catheter ready for use.   Electronically Signed   By: Daryll Brod M.D.   On: 03/31/2014 13:44   Ir US Guide Vasc Access Right  03/31/2014   CLINICAL DATA:  Metastatic prostate cancer  EXAM: RIGHT INTERNAL JUGULAR SINGLE LUMEN POWER PORT CATHETER INSERTION  Date:  4/20/20154/20/2015 1:33 PM  Radiologist:  Jerilynn Mages. Daryll Brod, MD  Guidance:  ULTRASOUND AND FLUOROSCOPIC  FLUOROSCOPY TIME:  1 MIN  MEDICATIONS AND MEDICAL HISTORY: 2 g Ancefadministered within 1 hour of the procedure.3 mg  Versed, 100 mcg fentanyl  ANESTHESIA/SEDATION: 30 min  CONTRAST:  None.  COMPLICATIONS: No immediate  PROCEDURE: Informed consent was obtained from the patient following explanation of the procedure, risks, benefits and alternatives. The patient understands, agrees and consents for the procedure. All questions were addressed. A time out was performed.  Maximal barrier sterile technique utilized including caps, mask, sterile gowns, sterile gloves, large sterile drape, hand hygiene, and 2% chlorhexidine scrub.  Under sterile conditions and local anesthesia, right internal jugular micropuncture venous access was performed. Access was performed with ultrasound. Images were obtained for documentation. A guide wire was inserted followed by a transitional dilator. This allowed insertion of a guide wire and catheter into the IVC. Measurements were obtained from the SVC / RA junction back to the right IJ venotomy site. In the right infraclavicular chest, a subcutaneous pocket was created over the second anterior rib. This was done under sterile conditions and local anesthesia. 1% lidocaine with epinephrine was utilized for this. A 2.5 cm incision was made in the skin. Blunt dissection was performed to create a subcutaneous pocket over the right pectoralis major muscle. The pocket was flushed with saline vigorously. There was adequate hemostasis. The port catheter was assembled and checked for leakage. The port catheter was secured in the pocket with two retention sutures. The tubing was tunneled subcutaneously to the Right venotomy site and inserted into the SVC/RA junction through a valved peel-away sheath. Position was confirmed with fluoroscopy. Images were obtained for documentation. The patient tolerated the procedure well. No immediate complications. Incisions were closed in a two layer fashion with 4 - 0  Vicryl suture. Dermabond was applied to the skin. The port catheter was accessed, blood was aspirated followed by  saline and heparin flushes. Needle was removed. A dry sterile dressing was applied.  IMPRESSION: Ultrasound and fluoroscopically guided right internal jugular single lumen power port catheter insertion. Tip in the SVC/RA junction. Catheter ready for use.   Electronically Signed   By: Daryll Brod M.D.   On: 03/31/2014 13:44    ASSESSMENT:  #1. 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  #2. Hot flashes secondary to [facility] agonist therapy plus Casodex. He has been on Casodex for a long time. Improved since discontinuing Casodex.  #3. Hypertension, controlled.  #4. Gastroesophageal reflux disease, controlled. #5. Constipation secondary to opioid use and noncompliance with laxative recommendations. #6. 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    PLAN:  #1. Decrease dexamethasone to 4 mg twice a day. #2. Docetaxel 75 mg per meter squared intravenously today followed by Neulasta 6 mg subcutaneously tomorrow. #3. Continue oxycodone for pain. #4. Continue eliquis 5 mg twice a day. #5. Senokot 3 tablets each night. #6. Office visit one week with CBC. Patient was warned that bone pain could increase due to Neulasta.   All questions were answered. The patient knows to call the clinic with any problems, questions or concerns. We can certainly see the patient much sooner if necessary.   I spent 25 minutes counseling the patient face to face. The total time spent in the appointment was 30 minutes.    Farrel Gobble, MD 04/03/2014 11:20 AM  DISCLAIMER:  This note was dictated with voice recognition software.  Similar sounding words can inadvertently be transcribed inaccurately and may not be corrected upon review.

## 2014-04-03 NOTE — Patient Instructions (Signed)
[facility] Discharge Instructions for Patients Receiving Chemotherapy  Today you received the following chemotherapy agents Taxotere (Docetaxel). To help prevent nausea and vomiting after your treatment, we encourage you to take your nausea medication as instructed per label directions. Begin taking it tomorrow morning and take it as often as prescribed for the next 48 hours.  If you develop nausea and vomiting that is not controlled by your nausea medication, call the clinic. If it is after clinic hours your family physician or the after hours number for the clinic or go to the Emergency Department. BELOW ARE SYMPTOMS THAT SHOULD BE REPORTED IMMEDIATELY:  *FEVER GREATER THAN 101.0 F  *CHILLS WITH OR WITHOUT FEVER  NAUSEA AND VOMITING THAT IS NOT CONTROLLED WITH YOUR NAUSEA MEDICATION  *UNUSUAL SHORTNESS OF BREATH  *UNUSUAL BRUISING OR BLEEDING  TENDERNESS IN MOUTH AND THROAT WITH OR WITHOUT PRESENCE OF ULCERS  *URINARY PROBLEMS  *BOWEL PROBLEMS  UNUSUAL RASH Items with * indicate a potential emergency and should be followed up as soon as possible. The doctor has instructed you to take Senokot 2 tablets daily. You are to DECREASE Dexamethasone to 4 mg 2 times daily. You were given a Jacob Mueller prescription for Oxycodone today, you have 30 days to have it filled. Return to clinic tomorrow as scheduled for Neulasta injection. Return to clinic in 1 week for doctor visit and lab work.  One of the nurses will contact you 24 hours after your treatment. Please let the nurse know about any problems that you may have experienced. Feel free to call the clinic you have any questions or concerns. The clinic phone number is [phone].   I have been informed and understand all the instructions given to me. I know to contact the clinic, my physician, or go to the Emergency Department if any problems should occur. I do not have any questions at this time, but understand that I may  call the clinic during office hours or the Patient Navigator at [phone] should I have any questions or need assistance in obtaining follow up care.    __________________________________________  _____________  __________ Signature of Patient or Authorized Representative            Date                   Time    __________________________________________ Nurse's Signature

## 2014-04-04 ENCOUNTER — Encounter (HOSPITAL_BASED_OUTPATIENT_CLINIC_OR_DEPARTMENT_OTHER): Payer: MEDICARE

## 2014-04-04 VITALS — BP 157/72 | HR 80 | Temp 98.2°F | Resp 18

## 2014-04-04 DIAGNOSIS — Z5189 Encounter for other specified aftercare: Secondary | ICD-10-CM

## 2014-04-04 DIAGNOSIS — C7951 Secondary malignant neoplasm of bone: Secondary | ICD-10-CM

## 2014-04-04 DIAGNOSIS — C7952 Secondary malignant neoplasm of bone marrow: Secondary | ICD-10-CM

## 2014-04-04 DIAGNOSIS — C61 Malignant neoplasm of prostate: Secondary | ICD-10-CM

## 2014-04-04 LAB — PSA: PSA: 16.36 ng/mL — ABNORMAL HIGH (ref <=4.00)

## 2014-04-04 MED ORDER — PEGFILGRASTIM INJECTION 6 MG/0.6ML
SUBCUTANEOUS | Status: AC
  Filled 2014-04-04: qty 0.6

## 2014-04-04 MED ORDER — PEGFILGRASTIM INJECTION 6 MG/0.6ML
6.0000 mg | Freq: Once | SUBCUTANEOUS | Status: AC
  Administered 2014-04-04: 6 mg via SUBCUTANEOUS

## 2014-04-04 NOTE — Progress Notes (Signed)
Jacob Mueller presents today for injection per MD orders. Neulasta 6mg  administered SQ in left Abdomen. Administration without incident. Patient tolerated well. Patient reports no real problems after chemo yesterday. Reports still has intermittent right side abdominal pain relieved with oxycodone. Reports taking all of his medications as instructed. Denies N/V, fever or chills.

## 2014-04-07 ENCOUNTER — Encounter (HOSPITAL_COMMUNITY): Payer: Self-pay | Admitting: Emergency Medicine

## 2014-04-07 ENCOUNTER — Emergency Department (HOSPITAL_COMMUNITY)
Admission: EM | Admit: 2014-04-07 | Discharge: 2014-04-07 | Disposition: A | Discharge status: Home / Self Care | Payer: MEDICARE | Attending: Emergency Medicine | Admitting: Emergency Medicine

## 2014-04-07 DIAGNOSIS — Z8739 Personal history of other diseases of the musculoskeletal system and connective tissue: Secondary | ICD-10-CM | POA: Insufficient documentation

## 2014-04-07 DIAGNOSIS — R197 Diarrhea, unspecified: Secondary | ICD-10-CM | POA: Insufficient documentation

## 2014-04-07 DIAGNOSIS — Z7902 Long term (current) use of antithrombotics/antiplatelets: Secondary | ICD-10-CM | POA: Insufficient documentation

## 2014-04-07 DIAGNOSIS — Z8719 Personal history of other diseases of the digestive system: Secondary | ICD-10-CM | POA: Insufficient documentation

## 2014-04-07 DIAGNOSIS — IMO0002 Reserved for concepts with insufficient information to code with codable children: Secondary | ICD-10-CM | POA: Insufficient documentation

## 2014-04-07 DIAGNOSIS — Z86718 Personal history of other venous thrombosis and embolism: Secondary | ICD-10-CM | POA: Insufficient documentation

## 2014-04-07 DIAGNOSIS — C61 Malignant neoplasm of prostate: Secondary | ICD-10-CM | POA: Insufficient documentation

## 2014-04-07 DIAGNOSIS — C7951 Secondary malignant neoplasm of bone: Secondary | ICD-10-CM | POA: Insufficient documentation

## 2014-04-07 DIAGNOSIS — C7952 Secondary malignant neoplasm of bone marrow: Secondary | ICD-10-CM

## 2014-04-07 DIAGNOSIS — Z87891 Personal history of nicotine dependence: Secondary | ICD-10-CM | POA: Insufficient documentation

## 2014-04-07 DIAGNOSIS — Z79899 Other long term (current) drug therapy: Secondary | ICD-10-CM | POA: Insufficient documentation

## 2014-04-07 DIAGNOSIS — R1110 Vomiting, unspecified: Secondary | ICD-10-CM

## 2014-04-07 DIAGNOSIS — I2510 Atherosclerotic heart disease of native coronary artery without angina pectoris: Secondary | ICD-10-CM | POA: Insufficient documentation

## 2014-04-07 DIAGNOSIS — Z7982 Long term (current) use of aspirin: Secondary | ICD-10-CM | POA: Insufficient documentation

## 2014-04-07 DIAGNOSIS — Z8673 Personal history of transient ischemic attack (TIA), and cerebral infarction without residual deficits: Secondary | ICD-10-CM | POA: Insufficient documentation

## 2014-04-07 DIAGNOSIS — I10 Essential (primary) hypertension: Secondary | ICD-10-CM | POA: Insufficient documentation

## 2014-04-07 DIAGNOSIS — R112 Nausea with vomiting, unspecified: Secondary | ICD-10-CM | POA: Insufficient documentation

## 2014-04-07 LAB — CBC WITH DIFFERENTIAL/PLATELET
Basophils Absolute: 0 10*3/uL (ref 0.0–0.1)
Basophils Relative: 2 % — ABNORMAL HIGH (ref 0–1)
Eosinophils Absolute: 0 10*3/uL (ref 0.0–0.7)
Eosinophils Relative: 1 % (ref 0–5)
HEMATOCRIT: 32 % — AB (ref 39.0–52.0)
Hemoglobin: 11 g/dL — ABNORMAL LOW (ref 13.0–17.0)
LYMPHS ABS: 0.8 10*3/uL (ref 0.7–4.0)
Lymphocytes Relative: 36 % (ref 12–46)
MCH: 30.3 pg (ref 26.0–34.0)
MCHC: 34.4 g/dL (ref 30.0–36.0)
MCV: 88.2 fL (ref 78.0–100.0)
MONOS PCT: 3 % (ref 3–12)
Monocytes Absolute: 0.1 10*3/uL (ref 0.1–1.0)
NEUTROS ABS: 1.3 10*3/uL — AB (ref 1.7–7.7)
Neutrophils Relative %: 58 % (ref 43–77)
Platelets: 130 10*3/uL — ABNORMAL LOW (ref 150–400)
RBC: 3.63 MIL/uL — AB (ref 4.22–5.81)
RDW: 13.3 % (ref 11.5–15.5)
WBC: 2.2 10*3/uL — AB (ref 4.0–10.5)

## 2014-04-07 LAB — BASIC METABOLIC PANEL
BUN: 13 mg/dL (ref 6–23)
CO2: 29 meq/L (ref 19–32)
CREATININE: 0.88 mg/dL (ref 0.50–1.35)
Calcium: 9.3 mg/dL (ref 8.4–10.5)
Chloride: 100 mEq/L (ref 96–112)
GFR calc Af Amer: >90 mL/min (ref >90)
GFR calc non Af Amer: 85 mL/min — ABNORMAL LOW (ref >90)
Glucose, Bld: 133 mg/dL — ABNORMAL HIGH (ref 70–99)
POTASSIUM: 3.2 meq/L — AB (ref 3.7–5.3)
Sodium: 140 mEq/L (ref 137–147)

## 2014-04-07 LAB — URINALYSIS, ROUTINE W REFLEX MICROSCOPIC
Bilirubin Urine: NEGATIVE
GLUCOSE, UA: NEGATIVE mg/dL
Hgb urine dipstick: NEGATIVE
KETONES UR: NEGATIVE mg/dL
LEUKOCYTES UA: NEGATIVE
Nitrite: NEGATIVE
Protein, ur: NEGATIVE mg/dL
SPECIFIC GRAVITY, URINE: 1.015 (ref 1.005–1.030)
Urobilinogen, UA: 0.2 mg/dL (ref 0.0–1.0)
pH: 6 (ref 5.0–8.0)

## 2014-04-07 MED ORDER — KETOROLAC TROMETHAMINE 30 MG/ML IJ SOLN
15.0000 mg | Freq: Once | INTRAMUSCULAR | Status: AC
  Administered 2014-04-07: 15 mg via INTRAVENOUS
  Filled 2014-04-07: qty 1

## 2014-04-07 MED ORDER — OXYCODONE-ACETAMINOPHEN 5-325 MG PO TABS: 1.0000 | ORAL_TABLET | Freq: Four times a day (QID) | ORAL | Status: DC | PRN

## 2014-04-07 MED ORDER — POTASSIUM CHLORIDE 10 MEQ/100ML IV SOLN
10.0000 meq | Freq: Once | INTRAVENOUS | Status: AC
  Administered 2014-04-07: 10 meq via INTRAVENOUS
  Filled 2014-04-07: qty 100

## 2014-04-07 MED ORDER — ONDANSETRON 4 MG PO TBDP: ORAL_TABLET | ORAL | Status: DC

## 2014-04-07 MED ORDER — HEPARIN SOD (PORK) LOCK FLUSH 100 UNIT/ML IV SOLN
INTRAVENOUS | Status: AC
  Filled 2014-04-07: qty 5

## 2014-04-07 MED ORDER — ONDANSETRON HCL 4 MG/2ML IJ SOLN
4.0000 mg | Freq: Once | INTRAMUSCULAR | Status: AC
  Administered 2014-04-07: 4 mg via INTRAVENOUS
  Filled 2014-04-07: qty 2

## 2014-04-07 MED ORDER — SODIUM CHLORIDE 0.9 % IV BOLUS (SEPSIS)
1000.0000 mL | Freq: Once | INTRAVENOUS | Status: AC
  Administered 2014-04-07: 1000 mL via INTRAVENOUS

## 2014-04-07 NOTE — ED Provider Notes (Signed)
CSN: [id number]     Arrival date & time 04/07/14  1501 History  This chart was scribed for Maudry Diego, MD by Marcha Dutton, ED Scribe. This patient was seen in room [facility]12[facility]12 and the patient's care was started at 7:23 PM.    Chief Complaint  Patient presents with  . Abdominal Pain    Patient is a 73 y.o. male presenting with abdominal pain. The history is provided by the patient and a relative. No language interpreter was used.  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 Pt reports feeling dehydrated and having difficulty sleeping. Relative states she is aware that chemo can cause diarrhea in pt's but believes this amount to be abnormal.   Past Medical History  Diagnosis Date  . Stroke   . Hypertension   . Coronary artery disease   . Arthritis   . Cancer     Prostate  . Acid reflux disease   . DVT (deep venous thrombosis)     bilateral legs    Past Surgical History  Procedure Laterality Date  . Knee arthroscopy Left   . Prostate biopsy  2014    Family History  Problem Relation Age of Onset  . Diabetes Mother     History  Substance Use Topics  . Smoking status: Former Research scientist (life sciences)  . Smokeless tobacco: Never Used  . Alcohol Use: No    Review of Systems  Constitutional: Negative for appetite change and fatigue.  HENT: Negative for congestion, ear discharge and sinus pressure.   Eyes: Negative for discharge.  Respiratory: Negative for cough.   Cardiovascular: Negative for chest pain.  Gastrointestinal:  Positive for nausea, vomiting, abdominal pain and diarrhea.  Genitourinary: Negative for frequency and hematuria.  Musculoskeletal: Negative for back pain.  Skin: Negative for rash.  Neurological: Negative for seizures and headaches.  Psychiatric/Behavioral: Negative for hallucinations.    Allergies  Claritin  Home Medications    Prior to Admission medications   Medication Sig Start Date End Date Taking? Authorizing Provider  amLODipine (NORVASC) 10 MG tablet Take 20 mg by mouth daily.     Historical Provider, MD  apixaban (ELIQUIS) 5 MG TABS tablet Take 5 mg by mouth 2 (two) times daily.    Historical Provider, MD  aspirin EC 81 MG tablet Take 81 mg by mouth daily.    Historical Provider, MD  dexamethasone (DECADRON) 4 MG tablet Take 8 mg by mouth 4 (four) times daily.  03/06/14   Farrel Gobble, MD  docusate sodium (COLACE) 100 MG capsule Take 100 mg by mouth daily.    Historical Provider, MD  Doxylamine Succinate, Sleep, (SLEEP AID PO) Take 1 tablet by mouth at bedtime as needed.    Historical Provider, MD  furosemide (LASIX) 40 MG tablet Take 40 mg by mouth daily.    Historical Provider, MD  hydrALAZINE (APRESOLINE) 50 MG tablet Take 50 mg by mouth 2 (two) times daily.    Historical Provider, MD  leuprolide (LUPRON) 11.25 MG injection Inject 11.25 mg into the muscle every 6 (six) months.  Historical Provider, MD  lidocaine-prilocaine (EMLA) cream Apply a quarter size amount to port site 1 hour prior to chemo. Do not rub in. Cover with plastic wrap. 03/13/14   Farrel Gobble, MD  lisinopril (PRINIVIL,ZESTRIL) 40 MG tablet Take 40 mg by mouth daily.    Historical Provider, MD  metoCLOPramide (REGLAN) 5 MG tablet The day after chemo take 1 tablet four times a day x 48 hours. Then may take 1 tab four times a day if needed for nausea/vomiting. 03/13/14   Farrel Gobble, MD  Multiple Minerals-Vitamins (PROSTEON PO) Take 1 tablet by mouth daily. Bone supplement    Historical Provider, MD   Multiple Vitamins-Minerals (MULTIVITAMINS THER. W/MINERALS) TABS tablet Take 1 tablet by mouth daily.    Historical Provider, MD  oxyCODONE (ROXICODONE) 5 MG immediate release tablet Take 2-3 tablets every 4 hours as needed for pain 04/03/14   Farrel Gobble, MD  prochlorperazine (COMPAZINE) 10 MG tablet The day after chemo take 1 tablet four times a day x 48 hours. Then may take 1 tab four times a day if needed for nausea/vomiting. 03/13/14   Farrel Gobble, MD  ranitidine (ZANTAC) 150 MG tablet Take 150 mg by mouth 2 (two) times daily as needed for heartburn. Can take up to four times a day if needed    Historical Provider, MD  senna (SENOKOT) 8.6 MG tablet Take 3 tablets by mouth at bedtime.     Historical Provider, MD  simvastatin (ZOCOR) 20 MG tablet Take 20 mg by mouth daily.    Historical Provider, MD    Triage Vitals: BP 149/77  Pulse 89  Temp(Src) 98.7 F (37.1 C)  Resp 18  Ht 5\' 9"  (1.753 m)  Wt 236 lb (107.049 kg)  BMI 34.84 kg/m2  SpO2 100%   Physical Exam  Nursing note and vitals reviewed. Constitutional: He is oriented to person, place, and time. He appears well-developed.  HENT:  Head: Normocephalic.  Mouth/Throat: Mucous membranes are dry.  Eyes: Conjunctivae and EOM are normal. No scleral icterus.  Neck: Neck supple. No tracheal deviation present. No thyromegaly present.  Cardiovascular: Normal rate and regular rhythm.  Exam reveals no gallop and no friction rub.   No murmur heard. Pulmonary/Chest: No stridor. He has no wheezes. He has no rales. He exhibits no tenderness.  Abdominal: He exhibits no distension. There is tenderness (mild diffuse tenderness throughout). There is no rebound.  Musculoskeletal: Normal range of motion. He exhibits no edema.  Lymphadenopathy:    He has no cervical adenopathy.  Neurological: He is oriented to person, place, and time. He exhibits normal muscle tone. Coordination normal.  Skin: Skin is warm. No rash noted. No erythema.   Psychiatric: He has a normal mood and affect. His behavior is normal.     ED Course  Procedures (including critical care time)  DIAGNOSTIC STUDIES: Oxygen Saturation is 100% on  RA, normal by my interpretation.    COORDINATION OF CARE: 7:27 PM- Pt advised of plan for treatment and pt agrees.   Labs Review Labs Reviewed  CBC WITH DIFFERENTIAL - Abnormal; Notable for the following:    WBC 2.2 (*)    RBC 3.63 (*)    Hemoglobin 11.0 (*)    HCT 32.0 (*)    Platelets 130 (*)    Basophils Relative 2 (*)    Neutro Abs 1.3 (*)    All other components within normal limits  BASIC METABOLIC PANEL - Abnormal; Notable for the following:    Potassium 3.2 (*)  Glucose, Bld 133 (*)    GFR calc non Af Amer 85 (*)    All other components within normal limits  URINALYSIS, ROUTINE W REFLEX MICROSCOPIC    Imaging Review No results found.   EKG Interpretation None      MDM   Final diagnoses:  None   The chart was scribed for me under my direct supervision.  I personally performed the history, physical, and medical decision making and all procedures in the evaluation of this patient.Maudry Diego, MD 04/07/14 [phone]

## 2014-04-07 NOTE — ED Notes (Signed)
Per pt's daughter, pt with bone mets receiving chemo. Has been having diarrhea for last couple of days and c/o abdominal pain.

## 2014-04-07 NOTE — Discharge Instructions (Signed)
Follow up with your md in 2-3 days.   Drink plenty of fluids.   °

## 2014-04-07 NOTE — ED Notes (Signed)
Pt sent over from cancer center for abdominal pain with n/v/d x3 days. Pt has prostate ca with spine metasis and last chemo was Thursday.

## 2014-04-10 ENCOUNTER — Encounter (HOSPITAL_BASED_OUTPATIENT_CLINIC_OR_DEPARTMENT_OTHER): Payer: MEDICARE

## 2014-04-10 ENCOUNTER — Encounter (HOSPITAL_COMMUNITY): Payer: Self-pay

## 2014-04-10 VITALS — BP 149/62 | HR 74 | Temp 97.9°F | Resp 20 | Wt 220.0 lb

## 2014-04-10 DIAGNOSIS — T451X5A Adverse effect of antineoplastic and immunosuppressive drugs, initial encounter: Secondary | ICD-10-CM

## 2014-04-10 DIAGNOSIS — D61810 Antineoplastic chemotherapy induced pancytopenia: Secondary | ICD-10-CM

## 2014-04-10 DIAGNOSIS — C7952 Secondary malignant neoplasm of bone marrow: Secondary | ICD-10-CM

## 2014-04-10 DIAGNOSIS — N1330 Unspecified hydronephrosis: Secondary | ICD-10-CM

## 2014-04-10 DIAGNOSIS — R590 Localized enlarged lymph nodes: Secondary | ICD-10-CM

## 2014-04-10 DIAGNOSIS — C7951 Secondary malignant neoplasm of bone: Secondary | ICD-10-CM

## 2014-04-10 DIAGNOSIS — I82409 Acute embolism and thrombosis of unspecified deep veins of unspecified lower extremity: Secondary | ICD-10-CM

## 2014-04-10 DIAGNOSIS — I82411 Acute embolism and thrombosis of right femoral vein: Secondary | ICD-10-CM

## 2014-04-10 DIAGNOSIS — C61 Malignant neoplasm of prostate: Secondary | ICD-10-CM

## 2014-04-10 LAB — CBC WITH DIFFERENTIAL/PLATELET
Basophils Absolute: 0 10*3/uL (ref 0.0–0.1)
Basophils Relative: 1 % (ref 0–1)
Eosinophils Absolute: 0 10*3/uL (ref 0.0–0.7)
Eosinophils Relative: 0 % (ref 0–5)
HEMATOCRIT: 29.5 % — AB (ref 39.0–52.0)
HEMOGLOBIN: 9.9 g/dL — AB (ref 13.0–17.0)
LYMPHS ABS: 1.3 10*3/uL (ref 0.7–4.0)
LYMPHS PCT: 20 % (ref 12–46)
MCH: 29.9 pg (ref 26.0–34.0)
MCHC: 33.6 g/dL (ref 30.0–36.0)
MCV: 89.1 fL (ref 78.0–100.0)
MONO ABS: 0.8 10*3/uL (ref 0.1–1.0)
MONOS PCT: 11 % (ref 3–12)
NEUTROS ABS: 4.6 10*3/uL (ref 1.7–7.7)
NEUTROS PCT: 68 % (ref 43–77)
Platelets: 224 10*3/uL (ref 150–400)
RBC: 3.31 MIL/uL — AB (ref 4.22–5.81)
RDW: 13.5 % (ref 11.5–15.5)
WBC Morphology: INCREASED
WBC: 6.8 10*3/uL (ref 4.0–10.5)

## 2014-04-10 NOTE — Progress Notes (Signed)
[facility]  OFFICE PROGRESS NOTE  No primary provider on file. No primary provider on file.  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 Lower extremity deep venous thromboses    CURRENT THERAPY: Radiotherapy to the lumbar spine, discontinuation of Casodex, depot Lupron 11.25 mg every 6 months with last treatment on 01/30/2014, eliquis 5 mg twice a day, oxycodone, docetaxel cycle 1 on 04/03/2014 followed by Neulasta 6 mg subcutaneously on 04/04/2014.  INTERVAL HISTORY: Jacob Mueller 73 y.o. 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  Appetite has improved. He did have an episode of diarrhea after relieving what appeared to be a fecal impaction last week. Right lower extremity swelling is decreased. He denies any sore mouth, or significantly worsening bone pain after Neulasta.. He denies any nasal drip, sore throat, earache, fever, night sweats, or worsening breast pain.  MEDICAL HISTORY: Past Medical History  Diagnosis Date  . Stroke   . Hypertension   . Coronary artery disease   . Arthritis   . Cancer     Prostate  . Acid reflux disease   . 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    ALLERGIES:  is allergic to claritin.  MEDICATIONS: has a current medication list which includes the following  prescription(s): amlodipine, apixaban, aspirin ec, clopidogrel, dexamethasone, docusate sodium, doxylamine succinate (sleep), furosemide, hydralazine, leuprolide, lidocaine-prilocaine, lisinopril, loperamide, metoclopramide, multiple minerals-vitamins, multivitamins ther. w/minerals, ondansetron, oxycodone, oxycodone-acetaminophen, prochlorperazine, ranitidine, senna, and simvastatin.  SURGICAL HISTORY:  Past Surgical History  Procedure Laterality Date  . Knee arthroscopy Left   . Prostate biopsy  2014    FAMILY HISTORY: family history includes Diabetes in his mother.  SOCIAL HISTORY:  reports that he has quit smoking. He has never used smokeless tobacco. He reports that he does not drink alcohol or use illicit drugs.  REVIEW OF SYSTEMS:  Other than that discussed above is noncontributory.  PHYSICAL EXAMINATION: ECOG PERFORMANCE STATUS: 1 - Symptomatic but completely ambulatory  Blood pressure 149/62, pulse 74, temperature 97.9 F (36.6 C), temperature source Oral, resp. rate 20, weight 220 lb (99.791 kg).  GENERAL:alert, no distress and comfortable SKIN: skin color, texture, turgor are normal, no rashes or significant lesions EYES: PERLA; Conjunctiva are pink and non-injected, sclera clear SINUSES: No redness or tenderness over maxillary or ethmoid sinuses OROPHARYNX:no exudate, no erythema on lips, buccal mucosa, or tongue. NECK: supple, thyroid normal size, non-tender, without nodularity. No masses CHEST: Increased AP diameter with bilateral gynecomastia. LYMPH:  no palpable lymphadenopathy in the cervical, axillary or inguinal LUNGS: clear to auscultation and percussion with normal breathing effort HEART: regular rate & rhythm and no murmurs. P2 is tolerable at the apex. ABDOMEN:abdomen soft, non-tender and normal bowel sounds MUSCULOSKELETAL:no cyanosis of digits and no clubbing. Range of motion normal. Right lower extremity 3+ swelling with positive Homans sign.  NEURO: alert &  oriented x 3 with fluent speech, no focal motor/sensory  deficits   LABORATORY DATA: Admission on 04/07/2014, Discharged on 04/07/2014  Component Date Value Ref Range Status  . WBC 04/07/2014 2.2* 4.0 - 10.5 K/uL Final  . RBC 04/07/2014 3.63* 4.22 - 5.81 MIL/uL Final  . Hemoglobin 04/07/2014 11.0* 13.0 - 17.0 g/dL Final  . HCT 04/07/2014 32.0* 39.0 - 52.0 % Final  . MCV 04/07/2014 88.2  78.0 - 100.0 fL Final  . MCH 04/07/2014 30.3  26.0 - 34.0 pg Final  . MCHC 04/07/2014 34.4  30.0 - 36.0 g/dL Final  . RDW 04/07/2014 13.3  11.5 - 15.5 % Final  . Platelets 04/07/2014 130* 150 - 400 K/uL Final  . Neutrophils Relative % 04/07/2014 58  43 - 77 % Final  . Lymphocytes Relative 04/07/2014 36  12 - 46 % Final  . Monocytes Relative 04/07/2014 3  3 - 12 % Final  . Eosinophils Relative 04/07/2014 1  0 - 5 % Final  . Basophils Relative 04/07/2014 2* 0 - 1 % Final  . Neutro Abs 04/07/2014 1.3* 1.7 - 7.7 K/uL Final  . Lymphs Abs 04/07/2014 0.8  0.7 - 4.0 K/uL Final  . Monocytes Absolute 04/07/2014 0.1  0.1 - 1.0 K/uL Final  . Eosinophils Absolute 04/07/2014 0.0  0.0 - 0.7 K/uL Final  . Basophils Absolute 04/07/2014 0.0  0.0 - 0.1 K/uL Final  . WBC Morphology 04/07/2014 ATYPICAL LYMPHOCYTES   Final   Comment: MILD LEFT SHIFT (1-5% METAS, OCC MYELO, OCC BANDS)                          DOHLE BODIES  . Sodium 04/07/2014 140  137 - 147 mEq/L Final  . Potassium 04/07/2014 3.2* 3.7 - 5.3 mEq/L Final  . Chloride 04/07/2014 100  96 - 112 mEq/L Final  . CO2 04/07/2014 29  19 - 32 mEq/L Final  . Glucose, Bld 04/07/2014 133* 70 - 99 mg/dL Final  . BUN 04/07/2014 13  6 - 23 mg/dL Final  . Creatinine, Ser 04/07/2014 0.88  0.50 - 1.35 mg/dL Final  . Calcium 04/07/2014 9.3  8.4 - 10.5 mg/dL Final  . GFR calc non Af Amer 04/07/2014 85* >90 mL/min Final  . GFR calc Af Amer 04/07/2014 >90  >90 mL/min Final   Comment: (NOTE)                          The eGFR has been calculated using the CKD EPI equation.                           This calculation has not been validated in all clinical situations.                          eGFR's persistently <90 mL/min signify possible Chronic Kidney                          Disease.  . Color, Urine 04/07/2014 YELLOW  YELLOW Final  . APPearance 04/07/2014 CLEAR  CLEAR Final  . Specific Gravity, Urine 04/07/2014 1.015  1.005 - 1.030 Final  . pH 04/07/2014 6.0  5.0 - 8.0 Final  . Glucose, UA 04/07/2014 NEGATIVE  NEGATIVE mg/dL Final  . Hgb urine dipstick 04/07/2014 NEGATIVE  NEGATIVE Final  . Bilirubin Urine 04/07/2014 NEGATIVE  NEGATIVE Final  . Ketones, ur 04/07/2014 NEGATIVE  NEGATIVE mg/dL Final  . Protein, ur 04/07/2014 NEGATIVE  NEGATIVE mg/dL Final  . Urobilinogen, UA 04/07/2014 0.2  0.0 - 1.0 mg/dL Final  . Nitrite 04/07/2014 NEGATIVE  NEGATIVE Final  . Leukocytes, UA 04/07/2014 NEGATIVE  NEGATIVE Final   MICROSCOPIC NOT DONE ON URINES WITH NEGATIVE PROTEIN, BLOOD, LEUKOCYTES, NITRITE, OR GLUCOSE <1000 mg/dL.  Infusion on 04/03/2014  Component Date Value Ref Range Status  . WBC 04/03/2014 5.4  4.0 - 10.5 K/uL Final  . RBC 04/03/2014 3.57* 4.22 - 5.81 MIL/uL Final  . Hemoglobin 04/03/2014 10.8* 13.0 - 17.0 g/dL Final  . HCT 04/03/2014 31.8* 39.0 - 52.0 % Final  . MCV 04/03/2014 89.1  78.0 - 100.0 fL Final  . MCH 04/03/2014 30.3  26.0 - 34.0 pg Final  . MCHC 04/03/2014 34.0  30.0 - 36.0 g/dL Final  . RDW 04/03/2014 13.3  11.5 - 15.5 % Final  . Platelets 04/03/2014 105* 150 - 400 K/uL Final   Comment: SPECIMEN CHECKED FOR CLOTS                          PLATELET COUNT CONFIRMED BY SMEAR  . Neutrophils Relative % 04/03/2014 61  43 - 77 % Final  . Neutro Abs 04/03/2014 3.3  1.7 - 7.7 K/uL Final  . Lymphocytes Relative 04/03/2014 25  12 - 46 % Final  . Lymphs Abs 04/03/2014 1.4  0.7 - 4.0 K/uL Final  . Monocytes Relative 04/03/2014 12  3 - 12 % Final  . Monocytes Absolute 04/03/2014 0.7  0.1 - 1.0 K/uL Final  . Eosinophils Relative 04/03/2014 1  0 - 5 %  Final  . Eosinophils Absolute 04/03/2014 0.1  0.0 - 0.7 K/uL Final  . Basophils Relative 04/03/2014 0  0 - 1 % Final  . Basophils Absolute 04/03/2014 0.0  0.0 - 0.1 K/uL Final  . Sodium 04/03/2014 138  137 - 147 mEq/L Final  . Potassium 04/03/2014 3.6* 3.7 - 5.3 mEq/L Final  . Chloride 04/03/2014 100  96 - 112 mEq/L Final  . CO2 04/03/2014 27  19 - 32 mEq/L Final  . Glucose, Bld 04/03/2014 174* 70 - 99 mg/dL Final  . BUN 04/03/2014 17  6 - 23 mg/dL Final  . Creatinine, Ser 04/03/2014 0.99  0.50 - 1.35 mg/dL Final  . Calcium 04/03/2014 9.4  8.4 - 10.5 mg/dL Final  . Total Protein 04/03/2014 7.2  6.0 - 8.3 g/dL Final  . Albumin 04/03/2014 3.5  3.5 - 5.2 g/dL Final  . AST 04/03/2014 23  0 - 37 U/L Final  . ALT 04/03/2014 15  0 - 53 U/L Final  . Alkaline Phosphatase 04/03/2014 87  39 - 117 U/L Final  . Total Bilirubin 04/03/2014 0.5  0.3 - 1.2 mg/dL Final  . GFR calc non Af Amer 04/03/2014 81* >90 mL/min Final  . GFR calc Af Amer 04/03/2014 >90  >90 mL/min Final   Comment: (NOTE)                          The eGFR has been calculated using the CKD EPI equation.                          This calculation has not been validated in all clinical situations.  eGFR's persistently <90 mL/min signify possible Chronic Kidney                          Disease.  Marland Kitchen PSA 04/03/2014 16.36* <=4.00 ng/mL Final   Comment: (NOTE)                          Test Methodology: ECLIA PSA (Electrochemiluminescence Immunoassay)                          For PSA values from 2.5-4.0, particularly in younger men <60 years                          old, the AUA and NCCN suggest testing for % Free PSA (3515) and                          evaluation of the rate of increase in PSA (PSA velocity).                          Performed at [facility] Outpatient Visit on 03/31/2014  Component Date Value Ref Range Status  . aPTT 03/31/2014 30  24 - 37 seconds Final  . WBC 03/31/2014 4.9   4.0 - 10.5 K/uL Final  . RBC 03/31/2014 3.67* 4.22 - 5.81 MIL/uL Final  . Hemoglobin 03/31/2014 11.0* 13.0 - 17.0 g/dL Final  . HCT 03/31/2014 32.6* 39.0 - 52.0 % Final  . MCV 03/31/2014 88.8  78.0 - 100.0 fL Final  . MCH 03/31/2014 30.0  26.0 - 34.0 pg Final  . MCHC 03/31/2014 33.7  30.0 - 36.0 g/dL Final  . RDW 03/31/2014 13.4  11.5 - 15.5 % Final  . Platelets 03/31/2014 92* 150 - 400 K/uL Final   Comment: SPECIMEN CHECKED FOR CLOTS                          PLATELET COUNT CONFIRMED BY SMEAR  . Neutrophils Relative % 03/31/2014 63  43 - 77 % Final  . Neutro Abs 03/31/2014 3.1  1.7 - 7.7 K/uL Final  . Lymphocytes Relative 03/31/2014 26  12 - 46 % Final  . Lymphs Abs 03/31/2014 1.3  0.7 - 4.0 K/uL Final  . Monocytes Relative 03/31/2014 10  3 - 12 % Final  . Monocytes Absolute 03/31/2014 0.5  0.1 - 1.0 K/uL Final  . Eosinophils Relative 03/31/2014 1  0 - 5 % Final  . Eosinophils Absolute 03/31/2014 0.0  0.0 - 0.7 K/uL Final  . Basophils Relative 03/31/2014 0  0 - 1 % Final  . Basophils Absolute 03/31/2014 0.0  0.0 - 0.1 K/uL Final  . Prothrombin Time 03/31/2014 13.0  11.6 - 15.2 seconds Final  . INR 03/31/2014 1.00  0.00 - 1.49 Final    PATHOLOGY: No new pathology.  Urinalysis    Component Value Date/Time   COLORURINE YELLOW 04/07/2014 1540   APPEARANCEUR CLEAR 04/07/2014 1540   LABSPEC 1.015 04/07/2014 1540   PHURINE 6.0 04/07/2014 1540   GLUCOSEU NEGATIVE 04/07/2014 1540   HGBUR NEGATIVE 04/07/2014 1540   BILIRUBINUR NEGATIVE 04/07/2014 1540   KETONESUR NEGATIVE 04/07/2014 1540   PROTEINUR NEGATIVE 04/07/2014 1540   UROBILINOGEN 0.2 04/07/2014 1540   NITRITE NEGATIVE 04/07/2014 1540   LEUKOCYTESUR  NEGATIVE 04/07/2014 1540    RADIOGRAPHIC STUDIES: Ir Fluoro Guide Cv Line Right  03/31/2014   CLINICAL DATA:  Metastatic prostate cancer  EXAM: RIGHT INTERNAL JUGULAR SINGLE LUMEN POWER PORT CATHETER INSERTION  Date:  4/20/20154/20/2015 1:33 PM  Radiologist:  Jerilynn Mages. Daryll Brod, MD  Guidance:   ULTRASOUND AND FLUOROSCOPIC  FLUOROSCOPY TIME:  1 MIN  MEDICATIONS AND MEDICAL HISTORY: 2 g Ancefadministered within 1 hour of the procedure.3 mg Versed, 100 mcg fentanyl  ANESTHESIA/SEDATION: 30 min  CONTRAST:  None.  COMPLICATIONS: No immediate  PROCEDURE: Informed consent was obtained from the patient following explanation of the procedure, risks, benefits and alternatives. The patient understands, agrees and consents for the procedure. All questions were addressed. A time out was performed.  Maximal barrier sterile technique utilized including caps, mask, sterile gowns, sterile gloves, large sterile drape, hand hygiene, and 2% chlorhexidine scrub.  Under sterile conditions and local anesthesia, right internal jugular micropuncture venous access was performed. Access was performed with ultrasound. Images were obtained for documentation. A guide wire was inserted followed by a transitional dilator. This allowed insertion of a guide wire and catheter into the IVC. Measurements were obtained from the SVC / RA junction back to the right IJ venotomy site. In the right infraclavicular chest, a subcutaneous pocket was created over the second anterior rib. This was done under sterile conditions and local anesthesia. 1% lidocaine with epinephrine was utilized for this. A 2.5 cm incision was made in the skin. Blunt dissection was performed to create a subcutaneous pocket over the right pectoralis major muscle. The pocket was flushed with saline vigorously. There was adequate hemostasis. The port catheter was assembled and checked for leakage. The port catheter was secured in the pocket with two retention sutures. The tubing was tunneled subcutaneously to the Right venotomy site and inserted into the SVC/RA junction through a valved peel-away sheath. Position was confirmed with fluoroscopy. Images were obtained for documentation. The patient tolerated the procedure well. No immediate complications. Incisions were closed in  a two layer fashion with 4 - 0 Vicryl suture. Dermabond was applied to the skin. The port catheter was accessed, blood was aspirated followed by saline and heparin flushes. Needle was removed. A dry sterile dressing was applied.  IMPRESSION: Ultrasound and fluoroscopically guided right internal jugular single lumen power port catheter insertion. Tip in the SVC/RA junction. Catheter ready for use.   Electronically Signed   By: Daryll Brod M.D.   On: 03/31/2014 13:44   Ir US Guide Vasc Access Right  03/31/2014   CLINICAL DATA:  Metastatic prostate cancer  EXAM: RIGHT INTERNAL JUGULAR SINGLE LUMEN POWER PORT CATHETER INSERTION  Date:  4/20/20154/20/2015 1:33 PM  Radiologist:  Jerilynn Mages. Daryll Brod, MD  Guidance:  ULTRASOUND AND FLUOROSCOPIC  FLUOROSCOPY TIME:  1 MIN  MEDICATIONS AND MEDICAL HISTORY: 2 g Ancefadministered within 1 hour of the procedure.3 mg Versed, 100 mcg fentanyl  ANESTHESIA/SEDATION: 30 min  CONTRAST:  None.  COMPLICATIONS: No immediate  PROCEDURE: Informed consent was obtained from the patient following explanation of the procedure, risks, benefits and alternatives. The patient understands, agrees and consents for the procedure. All questions were addressed. A time out was performed.  Maximal barrier sterile technique utilized including caps, mask, sterile gowns, sterile gloves, large sterile drape, hand hygiene, and 2% chlorhexidine scrub.  Under sterile conditions and local anesthesia, right internal jugular micropuncture venous access was performed. Access was performed with ultrasound. Images were obtained for documentation. A guide wire was inserted followed by  a transitional dilator. This allowed insertion of a guide wire and catheter into the IVC. Measurements were obtained from the SVC / RA junction back to the right IJ venotomy site. In the right infraclavicular chest, a subcutaneous pocket was created over the second anterior rib. This was done under sterile conditions and local anesthesia.  1% lidocaine with epinephrine was utilized for this. A 2.5 cm incision was made in the skin. Blunt dissection was performed to create a subcutaneous pocket over the right pectoralis major muscle. The pocket was flushed with saline vigorously. There was adequate hemostasis. The port catheter was assembled and checked for leakage. The port catheter was secured in the pocket with two retention sutures. The tubing was tunneled subcutaneously to the Right venotomy site and inserted into the SVC/RA junction through a valved peel-away sheath. Position was confirmed with fluoroscopy. Images were obtained for documentation. The patient tolerated the procedure well. No immediate complications. Incisions were closed in a two layer fashion with 4 - 0 Vicryl suture. Dermabond was applied to the skin. The port catheter was accessed, blood was aspirated followed by saline and heparin flushes. Needle was removed. A dry sterile dressing was applied.  IMPRESSION: Ultrasound and fluoroscopically guided right internal jugular single lumen power port catheter insertion. Tip in the SVC/RA junction. Catheter ready for use.   Electronically Signed   By: Daryll Brod M.D.   On: 03/31/2014 13:44    ASSESSMENT:  #1. 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  #2. Hot flashes secondary to [facility] agonist therapy plus Casodex. He has been on Casodex for a long time. Improved since discontinuing Casodex.  #3. Hypertension, controlled.  #4. Gastroesophageal reflux disease, controlled.  #5. 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    PLAN:  #1. Continue dexamethasone 4 mg twice a day. #2. Call if fever develops. #3. Followup in 2 weeks for cycle #2 of docetaxel.   All questions were answered. The patient knows to call the clinic with any problems,  questions or concerns. We can certainly see the patient much sooner if necessary.   I spent 25 minutes counseling the patient face to face. The total time spent in the appointment was 30 minutes.    Farrel Gobble, MD 04/10/2014 10:32 AM  DISCLAIMER:  This note was dictated with voice recognition software.  Similar sounding words can inadvertently be transcribed inaccurately and may not be corrected upon review.

## 2014-04-10 NOTE — Progress Notes (Signed)
Labs drawn today for cbc/diff 

## 2014-04-10 NOTE — Patient Instructions (Signed)
[place] Discharge Instructions  RECOMMENDATIONS MADE BY THE CONSULTANT AND ANY TEST RESULTS WILL BE SENT TO YOUR REFERRING PHYSICIAN.  EXAM FINDINGS BY THE PHYSICIAN TODAY AND SIGNS OR SYMPTOMS TO REPORT TO CLINIC OR PRIMARY PHYSICIAN:  Return on 5/14 @ 9:45 to see Dr. Barnet Glasgow and for chemo.    Thank you for choosing [place] to provide your oncology and hematology care.  To afford each patient quality time with our providers, please arrive at least 15 minutes before your scheduled appointment time.  With your help, our goal is to use those 15 minutes to complete the necessary work-up to ensure our physicians have the information they need to help with your evaluation and healthcare recommendations.    Effective January 1st, 2014, we ask that you re-schedule your appointment with our physicians should you arrive 10 or more minutes late for your appointment.  We strive to give you quality time with our providers, and arriving late affects you and other patients whose appointments are after yours.    Again, thank you for choosing [facility].  Our hope is that these requests will decrease the amount of time that you wait before being seen by our physicians.       _____________________________________________________________  Should you have questions after your visit to [facility], please contact our office at [phone] between the hours of 8:30 a.m. and 5:00 p.m.  Voicemails left after 4:30 p.m. will not be returned until the following business day.  For prescription refill requests, have your pharmacy contact our office with your prescription refill request.

## 2014-04-24 ENCOUNTER — Encounter (HOSPITAL_COMMUNITY): Payer: Self-pay | Admitting: Emergency Medicine

## 2014-04-24 ENCOUNTER — Encounter (HOSPITAL_COMMUNITY): Payer: MEDICARE | Attending: Hematology and Oncology

## 2014-04-24 ENCOUNTER — Encounter (HOSPITAL_COMMUNITY): Payer: Self-pay

## 2014-04-24 ENCOUNTER — Emergency Department (HOSPITAL_COMMUNITY)
Admission: EM | Admit: 2014-04-24 | Discharge: 2014-04-24 | Disposition: A | Discharge status: Home / Self Care | Payer: MEDICARE | Attending: Emergency Medicine | Admitting: Emergency Medicine

## 2014-04-24 ENCOUNTER — Emergency Department (HOSPITAL_BASED_OUTPATIENT_CLINIC_OR_DEPARTMENT_OTHER): Payer: MEDICARE

## 2014-04-24 ENCOUNTER — Emergency Department (HOSPITAL_COMMUNITY): Payer: MEDICARE

## 2014-04-24 VITALS — Wt 225.6 lb

## 2014-04-24 DIAGNOSIS — I10 Essential (primary) hypertension: Secondary | ICD-10-CM | POA: Insufficient documentation

## 2014-04-24 DIAGNOSIS — C7951 Secondary malignant neoplasm of bone: Secondary | ICD-10-CM | POA: Insufficient documentation

## 2014-04-24 DIAGNOSIS — Z7902 Long term (current) use of antithrombotics/antiplatelets: Secondary | ICD-10-CM | POA: Insufficient documentation

## 2014-04-24 DIAGNOSIS — Z79899 Other long term (current) drug therapy: Secondary | ICD-10-CM | POA: Insufficient documentation

## 2014-04-24 DIAGNOSIS — K5900 Constipation, unspecified: Secondary | ICD-10-CM | POA: Insufficient documentation

## 2014-04-24 DIAGNOSIS — C61 Malignant neoplasm of prostate: Secondary | ICD-10-CM | POA: Insufficient documentation

## 2014-04-24 DIAGNOSIS — C7952 Secondary malignant neoplasm of bone marrow: Secondary | ICD-10-CM

## 2014-04-24 DIAGNOSIS — Z7982 Long term (current) use of aspirin: Secondary | ICD-10-CM | POA: Insufficient documentation

## 2014-04-24 DIAGNOSIS — Z86718 Personal history of other venous thrombosis and embolism: Secondary | ICD-10-CM | POA: Insufficient documentation

## 2014-04-24 DIAGNOSIS — I82411 Acute embolism and thrombosis of right femoral vein: Secondary | ICD-10-CM

## 2014-04-24 DIAGNOSIS — M7989 Other specified soft tissue disorders: Secondary | ICD-10-CM | POA: Insufficient documentation

## 2014-04-24 DIAGNOSIS — Z87891 Personal history of nicotine dependence: Secondary | ICD-10-CM | POA: Insufficient documentation

## 2014-04-24 DIAGNOSIS — I82409 Acute embolism and thrombosis of unspecified deep veins of unspecified lower extremity: Secondary | ICD-10-CM

## 2014-04-24 DIAGNOSIS — E860 Dehydration: Secondary | ICD-10-CM | POA: Insufficient documentation

## 2014-04-24 DIAGNOSIS — D696 Thrombocytopenia, unspecified: Secondary | ICD-10-CM

## 2014-04-24 DIAGNOSIS — N1330 Unspecified hydronephrosis: Secondary | ICD-10-CM

## 2014-04-24 DIAGNOSIS — R109 Unspecified abdominal pain: Secondary | ICD-10-CM | POA: Insufficient documentation

## 2014-04-24 DIAGNOSIS — M129 Arthropathy, unspecified: Secondary | ICD-10-CM | POA: Insufficient documentation

## 2014-04-24 DIAGNOSIS — I2510 Atherosclerotic heart disease of native coronary artery without angina pectoris: Secondary | ICD-10-CM | POA: Insufficient documentation

## 2014-04-24 DIAGNOSIS — R5383 Other fatigue: Secondary | ICD-10-CM

## 2014-04-24 DIAGNOSIS — R350 Frequency of micturition: Secondary | ICD-10-CM | POA: Insufficient documentation

## 2014-04-24 DIAGNOSIS — R599 Enlarged lymph nodes, unspecified: Secondary | ICD-10-CM

## 2014-04-24 DIAGNOSIS — K219 Gastro-esophageal reflux disease without esophagitis: Secondary | ICD-10-CM | POA: Insufficient documentation

## 2014-04-24 DIAGNOSIS — R05 Cough: Secondary | ICD-10-CM | POA: Insufficient documentation

## 2014-04-24 DIAGNOSIS — R531 Weakness: Secondary | ICD-10-CM

## 2014-04-24 DIAGNOSIS — R5381 Other malaise: Secondary | ICD-10-CM | POA: Insufficient documentation

## 2014-04-24 DIAGNOSIS — IMO0002 Reserved for concepts with insufficient information to code with codable children: Secondary | ICD-10-CM | POA: Insufficient documentation

## 2014-04-24 DIAGNOSIS — M6281 Muscle weakness (generalized): Secondary | ICD-10-CM | POA: Insufficient documentation

## 2014-04-24 DIAGNOSIS — Z9889 Other specified postprocedural states: Secondary | ICD-10-CM | POA: Insufficient documentation

## 2014-04-24 DIAGNOSIS — D6182 Myelophthisis: Secondary | ICD-10-CM

## 2014-04-24 DIAGNOSIS — R110 Nausea: Secondary | ICD-10-CM | POA: Insufficient documentation

## 2014-04-24 DIAGNOSIS — Z8673 Personal history of transient ischemic attack (TIA), and cerebral infarction without residual deficits: Secondary | ICD-10-CM | POA: Insufficient documentation

## 2014-04-24 DIAGNOSIS — D649 Anemia, unspecified: Secondary | ICD-10-CM

## 2014-04-24 DIAGNOSIS — Z5111 Encounter for antineoplastic chemotherapy: Secondary | ICD-10-CM

## 2014-04-24 DIAGNOSIS — G479 Sleep disorder, unspecified: Secondary | ICD-10-CM | POA: Insufficient documentation

## 2014-04-24 DIAGNOSIS — R059 Cough, unspecified: Secondary | ICD-10-CM | POA: Insufficient documentation

## 2014-04-24 DIAGNOSIS — R590 Localized enlarged lymph nodes: Secondary | ICD-10-CM

## 2014-04-24 LAB — URINALYSIS, ROUTINE W REFLEX MICROSCOPIC
BILIRUBIN URINE: NEGATIVE
Glucose, UA: NEGATIVE mg/dL
Hgb urine dipstick: NEGATIVE
Ketones, ur: NEGATIVE mg/dL
LEUKOCYTES UA: NEGATIVE
NITRITE: NEGATIVE
PH: 6 (ref 5.0–8.0)
Protein, ur: NEGATIVE mg/dL
Specific Gravity, Urine: 1.015 (ref 1.005–1.030)
UROBILINOGEN UA: 0.2 mg/dL (ref 0.0–1.0)

## 2014-04-24 LAB — COMPREHENSIVE METABOLIC PANEL
ALT: 19 U/L (ref 0–53)
AST: 19 U/L (ref 0–37)
Albumin: 3.1 g/dL — ABNORMAL LOW (ref 3.5–5.2)
Alkaline Phosphatase: 90 U/L (ref 39–117)
BUN: 13 mg/dL (ref 6–23)
CO2: 27 meq/L (ref 19–32)
Calcium: 9.2 mg/dL (ref 8.4–10.5)
Chloride: 100 mEq/L (ref 96–112)
Creatinine, Ser: 0.73 mg/dL (ref 0.50–1.35)
GLUCOSE: 185 mg/dL — AB (ref 70–99)
Potassium: 3.4 mEq/L — ABNORMAL LOW (ref 3.7–5.3)
SODIUM: 138 meq/L (ref 137–147)
TOTAL PROTEIN: 6.6 g/dL (ref 6.0–8.3)
Total Bilirubin: 0.5 mg/dL (ref 0.3–1.2)

## 2014-04-24 LAB — CBC WITH DIFFERENTIAL/PLATELET
Basophils Absolute: 0 10*3/uL (ref 0.0–0.1)
Basophils Relative: 0 % (ref 0–1)
EOS ABS: 0.1 10*3/uL (ref 0.0–0.7)
EOS PCT: 0 % (ref 0–5)
HCT: 29.6 % — ABNORMAL LOW (ref 39.0–52.0)
HEMOGLOBIN: 10 g/dL — AB (ref 13.0–17.0)
LYMPHS ABS: 1.6 10*3/uL (ref 0.7–4.0)
LYMPHS PCT: 13 % (ref 12–46)
MCH: 30.3 pg (ref 26.0–34.0)
MCHC: 33.8 g/dL (ref 30.0–36.0)
MCV: 89.7 fL (ref 78.0–100.0)
MONOS PCT: 9 % (ref 3–12)
Monocytes Absolute: 1.1 10*3/uL — ABNORMAL HIGH (ref 0.1–1.0)
Neutro Abs: 9.5 10*3/uL — ABNORMAL HIGH (ref 1.7–7.7)
Neutrophils Relative %: 78 % — ABNORMAL HIGH (ref 43–77)
Platelets: 255 10*3/uL (ref 150–400)
RBC: 3.3 MIL/uL — AB (ref 4.22–5.81)
RDW: 15.3 % (ref 11.5–15.5)
WBC: 12.2 10*3/uL — ABNORMAL HIGH (ref 4.0–10.5)

## 2014-04-24 LAB — LIPASE, BLOOD: Lipase: 30 U/L (ref 11–59)

## 2014-04-24 MED ORDER — TEMAZEPAM 15 MG PO CAPS: 15.0000 mg | ORAL_CAPSULE | Freq: Every evening | ORAL | Status: DC | PRN

## 2014-04-24 MED ORDER — DEXAMETHASONE SODIUM PHOSPHATE 10 MG/ML IJ SOLN: 10.0000 mg | Freq: Once | INTRAMUSCULAR | Status: DC

## 2014-04-24 MED ORDER — HEPARIN SOD (PORK) LOCK FLUSH 100 UNIT/ML IV SOLN
500.0000 [IU] | Freq: Once | INTRAVENOUS | Status: AC | PRN
Start: 2014-04-24 — End: 2014-04-24
  Administered 2014-04-24: 500 [IU]
  Filled 2014-04-24: qty 5

## 2014-04-24 MED ORDER — SODIUM CHLORIDE 0.9 % IV SOLN
Freq: Once | INTRAVENOUS | Status: AC
Start: 2014-04-24 — End: 2014-04-24
  Administered 2014-04-24: 11:00:00 via INTRAVENOUS

## 2014-04-24 MED ORDER — SODIUM CHLORIDE 0.9 % IV BOLUS (SEPSIS)
1000.0000 mL | Freq: Once | INTRAVENOUS | Status: AC
  Administered 2014-04-24: 1000 mL via INTRAVENOUS

## 2014-04-24 MED ORDER — ONDANSETRON HCL 40 MG/20ML IJ SOLN
Freq: Once | INTRAMUSCULAR | Status: AC
  Administered 2014-04-24: 8 mg via INTRAVENOUS
  Filled 2014-04-24: qty 4

## 2014-04-24 MED ORDER — OXYCODONE HCL 10 MG PO TABS: ORAL_TABLET | ORAL | Status: DC

## 2014-04-24 MED ORDER — SODIUM CHLORIDE 0.9 % IV SOLN: 8.0000 mg | Freq: Once | INTRAVENOUS | Status: DC

## 2014-04-24 MED ORDER — SODIUM CHLORIDE 0.9 % IJ SOLN: 10.0000 mL | INTRAMUSCULAR | Status: DC | PRN

## 2014-04-24 MED ORDER — FUROSEMIDE 40 MG PO TABS: 40.0000 mg | ORAL_TABLET | Freq: Every day | ORAL | Status: DC

## 2014-04-24 MED ORDER — DOCETAXEL CHEMO INJECTION 160 MG/16ML
75.0000 mg/m2 | Freq: Once | INTRAVENOUS | Status: AC
  Administered 2014-04-24: 170 mg via INTRAVENOUS
  Filled 2014-04-24: qty 17

## 2014-04-24 NOTE — ED Notes (Signed)
Pt being discharged. Jacob Mueller called and consulted concerning pt's accessed port. Cancer center reported to leave pt port accessed and saline lock for transport. Pt and pt family aware. nad noted.

## 2014-04-24 NOTE — Patient Instructions (Addendum)
[place] Discharge Instructions  RECOMMENDATIONS MADE BY THE CONSULTANT AND ANY TEST RESULTS WILL BE SENT TO YOUR REFERRING PHYSICIAN.  EXAM FINDINGS BY THE PHYSICIAN TODAY AND SIGNS OR SYMPTOMS TO REPORT TO CLINIC OR PRIMARY PHYSICIAN: Exam and findings as discussed by Dr. Barnet Glasgow.  Will proceed with therapy.  Report fevers, chills, uncontrolled nausea, vomiting, increased shortness of breath, etc.  MEDICATIONS PRESCRIBED:  Increase your Dexamethasone (Decadron) to 8 mg daily. Oxycodone 10 mg take as directed Temazepam for sleep   INSTRUCTIONS/FOLLOW-UP: Follow-up in 3 weeks with therapy and office visit as scheduled.  Thank you for choosing [place] to provide your oncology and hematology care.  To afford each patient quality time with our providers, please arrive at least 15 minutes before your scheduled appointment time.  With your help, our goal is to use those 15 minutes to complete the necessary work-up to ensure our physicians have the information they need to help with your evaluation and healthcare recommendations.    Effective January 1st, 2014, we ask that you re-schedule your appointment with our physicians should you arrive 10 or more minutes late for your appointment.  We strive to give you quality time with our providers, and arriving late affects you and other patients whose appointments are after yours.    Again, thank you for choosing [facility].  Our hope is that these requests will decrease the amount of time that you wait before being seen by our physicians.       _____________________________________________________________  Should you have questions after your visit to [facility], please contact our office at [phone] between the hours of 8:30 a.m. and 5:00 p.m.  Voicemails left after 4:30 p.m. will not be returned until the following business day.  For prescription refill requests, have your  pharmacy contact our office with your prescription refill request.

## 2014-04-24 NOTE — ED Notes (Signed)
Numbing cream removed prior to accessing port.

## 2014-04-24 NOTE — ED Notes (Signed)
Dressing applied to RLE shin. Dressing included Non adherent Telfa and paper tape to site. Pt tolerated well.nad noted.

## 2014-04-24 NOTE — ED Notes (Addendum)
Pt daughter at bedside with pt numbing cream for port. Pt requesting numbing cream be applied before port accessed. Per pt request, pt family applying numbing cream to port site. Numbing cream  prescription verified prior to family applying cream to site.Pt will be transported to xray for chest xray to be completed and port will be accessed once pt returns. Pt and pt family aware and agreed with plan of care. Lab already collected ordered blood work. Pt tolerated well.

## 2014-04-24 NOTE — Progress Notes (Signed)
[facility]  OFFICE PROGRESS NOTE  No primary provider on file. No primary provider on file.  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 8 for prostate cancer with lymph node and bone metastases  . DVT right lower extremity   CURRENT THERAPY: Docetaxel for cycle #2 today. Leuprolide every 6 months.  INTERVAL HISTORY: Jacob Mueller 73 y.o. 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 He went to the emergency room this morning complaining of "weakness." Right lower extremity swelling persists. He denies any significant pain. He also denies any peripheral paresthesias in the lower extremities but has had chronic upper extremity paresthesias for years. He denies any diarrhea, constipation, melena, hematochezia, epistaxis, or hematuria. He also denies any urinary hesitancy or incontinence.  MEDICAL HISTORY: Past Medical History  Diagnosis Date  . Stroke   . Hypertension   . Coronary artery disease   . Arthritis   . Cancer     Prostate  . Acid reflux disease   . 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 PSA  on 01/30/2014 was 18.48 Developed RLE swelling with pain and diagnosed in [place] with DVT, currently on Eliquis 62m BID. Lumbar spine MRI showed epidural disease treated with RT for 10 treatments ending [place]24/2014 [place] Cas[place]x discontinued as a systemic therapeutic maneuver 03/12/2014. Chemotherapy with Docetaxel started 04/03/2014.  ALLERGIES:  is allergic to claritin.  MEDICATIONS: has a current medication list which includes the following prescription(s): amlodipine, apixaban, aspirin ec, dexamethasone, docusate sodium, doxylamine succinate (sleep), furosemide, hydralazine, leuprolide, lidocaine-prilocaine, lisinopril, loperamide, metoclopramide, multiple minerals-vitamins, multivitamins ther. w/minerals, ondansetron, oxycodone hcl, prochlorperazine, ranitidine, senna, simvastatin, clopidogrel, and temazepam, and the following Facility-Administered Medications: DOCEtaxel (TAXOTERE) 170 mg in dextrose 5 % 250 mL chemo infusion, heparin lock flush, and sodium chloride.  SURGICAL HISTORY:  Past Surgical History  Procedure Laterality Date  . Knee arthroscopy Left   . Prostate biopsy  2014    FAMILY HISTORY: family history includes Diabetes in his mother.  SOCIAL HISTORY:  reports that he has quit smoking. He has never used smokeless tobacco. He reports that he does not drink alcohol or use illicit drugs.  REVIEW OF SYSTEMS:  Other than that discussed above is noncontributory.  PHYSICAL EXAMINATION: ECOG PERFORMANCE STATUS: 2 - Symptomatic, <50% confined to bed  Weight 225 lb 9.6 oz (102.331 kg).  GENERAL:alert, no distress and comfortable SKIN: skin color, texture, turgor are normal, no rashes or significant lesions EYES: PERLA; Conjunctiva are pink and non-injected, sclera clear SINUSES: No redness or tenderness over maxillary or ethmoid sinuses OROPHARYNX:no exudate, no erythema on lips, buccal mucosa, or tongue. NECK: supple, thyroid normal size, non-tender, without nodularity. No  masses CHEST: Increased AP diameter with bilateral gynecomastia. Right anterior  chest life port LYMPH:  no palpable lymphadenopathy in the cervical, axillary or inguinal LUNGS: clear to auscultation and percussion with normal breathing effort HEART: regular rate & rhythm and no murmurs. ABDOMEN:abdomen soft, non-tender and normal bowel sounds MUSCULOSKELETAL:no cyanosis of digits and no clubbing. Range of motion normal. Right lower 70 swelling +2 with negative Homans sign. There is an area of skin breakdown in the mid shin which is nonpurulent and non-hemorrhagic. NEURO: alert & oriented x 3 with fluent speech, no focal motor/sensory deficits. Deep tendon reflexes are increased in both lower extremities.   LABORATORY DATA: Admission on 04/24/2014, Discharged on 04/24/2014  Component Date Value Ref Range Status  . WBC 04/24/2014 12.2* 4.0 - 10.5 K/uL Final  . RBC 04/24/2014 3.30* 4.22 - 5.81 MIL/uL Final  . Hemoglobin 04/24/2014 10.0* 13.0 - 17.0 g/dL Final  . HCT 04/24/2014 29.6* 39.0 - 52.0 % Final  . MCV 04/24/2014 89.7  78.0 - 100.0 fL Final  . MCH 04/24/2014 30.3  26.0 - 34.0 pg Final  . MCHC 04/24/2014 33.8  30.0 - 36.0 g/dL Final  . RDW 04/24/2014 15.3  11.5 - 15.5 % Final  . Platelets 04/24/2014 255  150 - 400 K/uL Final  . Neutrophils Relative % 04/24/2014 78* 43 - 77 % Final  . Neutro Abs 04/24/2014 9.5* 1.7 - 7.7 K/uL Final  . Lymphocytes Relative 04/24/2014 13  12 - 46 % Final  . Lymphs Abs 04/24/2014 1.6  0.7 - 4.0 K/uL Final  . Monocytes Relative 04/24/2014 9  3 - 12 % Final  . Monocytes Absolute 04/24/2014 1.1* 0.1 - 1.0 K/uL Final  . Eosinophils Relative 04/24/2014 0  0 - 5 % Final  . Eosinophils Absolute 04/24/2014 0.1  0.0 - 0.7 K/uL Final  . Basophils Relative 04/24/2014 0  0 - 1 % Final  . Basophils Absolute 04/24/2014 0.0  0.0 - 0.1 K/uL Final  . Sodium 04/24/2014 138  137 - 147 mEq/L Final  . Potassium 04/24/2014 3.4* 3.7 - 5.3 mEq/L Final  . Chloride 04/24/2014  100  96 - 112 mEq/L Final  . CO2 04/24/2014 27  19 - 32 mEq/L Final  . Glucose, Bld 04/24/2014 185* 70 - 99 mg/dL Final  . BUN 04/24/2014 13  6 - 23 mg/dL Final  . Creatinine, Ser 04/24/2014 0.73  0.50 - 1.35 mg/dL Final  . Calcium 04/24/2014 9.2  8.4 - 10.5 mg/dL Final  . Total Protein 04/24/2014 6.6  6.0 - 8.3 g/dL Final  . Albumin 04/24/2014 3.1* 3.5 - 5.2 g/dL Final  . AST 04/24/2014 19  0 - 37 U/L Final  . ALT 04/24/2014 19  0 - 53 U/L Final  . Alkaline Phosphatase 04/24/2014 90  39 - 117 U/L Final  . Total Bilirubin 04/24/2014 0.5  0.3 - 1.2 mg/dL Final  . GFR calc non Af Amer 04/24/2014 >90  >90 mL/min Final  . GFR calc Af Amer 04/24/2014 >90  >90 mL/min Final   Comment: (NOTE)                          The eGFR has been calculated using the CKD EPI equation.                          This calculation has not been validated in all clinical situations.  eGFR's persistently <90 mL/min signify possible Chronic Kidney                          Disease.  . Color, Urine 04/24/2014 YELLOW  YELLOW Final  . APPearance 04/24/2014 CLEAR  CLEAR Final  . Specific Gravity, Urine 04/24/2014 1.015  1.005 - 1.030 Final  . pH 04/24/2014 6.0  5.0 - 8.0 Final  . Glucose, UA 04/24/2014 NEGATIVE  NEGATIVE mg/dL Final  . Hgb urine dipstick 04/24/2014 NEGATIVE  NEGATIVE Final  . Bilirubin Urine 04/24/2014 NEGATIVE  NEGATIVE Final  . Ketones, ur 04/24/2014 NEGATIVE  NEGATIVE mg/dL Final  . Protein, ur 04/24/2014 NEGATIVE  NEGATIVE mg/dL Final  . Urobilinogen, UA 04/24/2014 0.2  0.0 - 1.0 mg/dL Final  . Nitrite 04/24/2014 NEGATIVE  NEGATIVE Final  . Leukocytes, UA 04/24/2014 NEGATIVE  NEGATIVE Final   MICROSCOPIC NOT DONE ON URINES WITH NEGATIVE PROTEIN, BLOOD, LEUKOCYTES, NITRITE, OR GLUCOSE <1000 mg/dL.  . Lipase 04/24/2014 30  11 - 59 U/L Final  Infusion on 04/10/2014  Component Date Value Ref Range Status  . WBC 04/10/2014 6.8  4.0 - 10.5 K/uL Final  . RBC 04/10/2014 3.31*  4.22 - 5.81 MIL/uL Final  . Hemoglobin 04/10/2014 9.9* 13.0 - 17.0 g/dL Final  . HCT 04/10/2014 29.5* 39.0 - 52.0 % Final  . MCV 04/10/2014 89.1  78.0 - 100.0 fL Final  . MCH 04/10/2014 29.9  26.0 - 34.0 pg Final  . MCHC 04/10/2014 33.6  30.0 - 36.0 g/dL Final  . RDW 04/10/2014 13.5  11.5 - 15.5 % Final  . Platelets 04/10/2014 224  150 - 400 K/uL Final  . Neutrophils Relative % 04/10/2014 68  43 - 77 % Final  . Neutro Abs 04/10/2014 4.6  1.7 - 7.7 K/uL Final  . Lymphocytes Relative 04/10/2014 20  12 - 46 % Final  . Lymphs Abs 04/10/2014 1.3  0.7 - 4.0 K/uL Final  . Monocytes Relative 04/10/2014 11  3 - 12 % Final  . Monocytes Absolute 04/10/2014 0.8  0.1 - 1.0 K/uL Final  . Eosinophils Relative 04/10/2014 0  0 - 5 % Final  . Eosinophils Absolute 04/10/2014 0.0  0.0 - 0.7 K/uL Final  . Basophils Relative 04/10/2014 1  0 - 1 % Final  . Basophils Absolute 04/10/2014 0.0  0.0 - 0.1 K/uL Final  . WBC Morphology 04/10/2014 INCREASED BANDS (>20% BANDS)   Final  Admission on 04/07/2014, Discharged on 04/07/2014  Component Date Value Ref Range Status  . WBC 04/07/2014 2.2* 4.0 - 10.5 K/uL Final  . RBC 04/07/2014 3.63* 4.22 - 5.81 MIL/uL Final  . Hemoglobin 04/07/2014 11.0* 13.0 - 17.0 g/dL Final  . HCT 04/07/2014 32.0* 39.0 - 52.0 % Final  . MCV 04/07/2014 88.2  78.0 - 100.0 fL Final  . MCH 04/07/2014 30.3  26.0 - 34.0 pg Final  . MCHC 04/07/2014 34.4  30.0 - 36.0 g/dL Final  . RDW 04/07/2014 13.3  11.5 - 15.5 % Final  . Platelets 04/07/2014 130* 150 - 400 K/uL Final  . Neutrophils Relative % 04/07/2014 58  43 - 77 % Final  . Lymphocytes Relative 04/07/2014 36  12 - 46 % Final  . Monocytes Relative 04/07/2014 3  3 - 12 % Final  . Eosinophils Relative 04/07/2014 1  0 - 5 % Final  . Basophils Relative 04/07/2014 2* 0 - 1 % Final  . Neutro Abs 04/07/2014 1.3* 1.7 - 7.7 K/uL Final  .   Lymphs Abs 04/07/2014 0.8  0.7 - 4.0 K/uL Final  . Monocytes Absolute 04/07/2014 0.1  0.1 - 1.0 K/uL Final  .  Eosinophils Absolute 04/07/2014 0.0  0.0 - 0.7 K/uL Final  . Basophils Absolute 04/07/2014 0.0  0.0 - 0.1 K/uL Final  . WBC Morphology 04/07/2014 ATYPICAL LYMPHOCYTES   Final   Comment: MILD LEFT SHIFT (1-5% METAS, OCC MYELO, OCC BANDS)                          DOHLE BODIES  . Sodium 04/07/2014 140  137 - 147 mEq/L Final  . Potassium 04/07/2014 3.2* 3.7 - 5.3 mEq/L Final  . Chloride 04/07/2014 100  96 - 112 mEq/L Final  . CO2 04/07/2014 29  19 - 32 mEq/L Final  . Glucose, Bld 04/07/2014 133* 70 - 99 mg/dL Final  . BUN 04/07/2014 13  6 - 23 mg/dL Final  . Creatinine, Ser 04/07/2014 0.88  0.50 - 1.35 mg/dL Final  . Calcium 04/07/2014 9.3  8.4 - 10.5 mg/dL Final  . GFR calc non Af Amer 04/07/2014 85* >90 mL/min Final  . GFR calc Af Amer 04/07/2014 >90  >90 mL/min Final   Comment: (NOTE)                          The eGFR has been calculated using the CKD EPI equation.                          This calculation has not been validated in all clinical situations.                          eGFR's persistently <90 mL/min signify possible Chronic Kidney                          Disease.  . Color, Urine 04/07/2014 YELLOW  YELLOW Final  . APPearance 04/07/2014 CLEAR  CLEAR Final  . Specific Gravity, Urine 04/07/2014 1.015  1.005 - 1.030 Final  . pH 04/07/2014 6.0  5.0 - 8.0 Final  . Glucose, UA 04/07/2014 NEGATIVE  NEGATIVE mg/dL Final  . Hgb urine dipstick 04/07/2014 NEGATIVE  NEGATIVE Final  . Bilirubin Urine 04/07/2014 NEGATIVE  NEGATIVE Final  . Ketones, ur 04/07/2014 NEGATIVE  NEGATIVE mg/dL Final  . Protein, ur 04/07/2014 NEGATIVE  NEGATIVE mg/dL Final  . Urobilinogen, UA 04/07/2014 0.2  0.0 - 1.0 mg/dL Final  . Nitrite 04/07/2014 NEGATIVE  NEGATIVE Final  . Leukocytes, UA 04/07/2014 NEGATIVE  NEGATIVE Final   MICROSCOPIC NOT DONE ON URINES WITH NEGATIVE PROTEIN, BLOOD, LEUKOCYTES, NITRITE, OR GLUCOSE <1000 mg/dL.  Infusion on 04/03/2014  Component Date Value Ref Range Status  . WBC  04/03/2014 5.4  4.0 - 10.5 K/uL Final  . RBC 04/03/2014 3.57* 4.22 - 5.81 MIL/uL Final  . Hemoglobin 04/03/2014 10.8* 13.0 - 17.0 g/dL Final  . HCT 04/03/2014 31.8* 39.0 - 52.0 % Final  . MCV 04/03/2014 89.1  78.0 - 100.0 fL Final  . MCH 04/03/2014 30.3  26.0 - 34.0 pg Final  . MCHC 04/03/2014 34.0  30.0 - 36.0 g/dL Final  . RDW 04/03/2014 13.3  11.5 - 15.5 % Final  . Platelets 04/03/2014 105* 150 - 400 K/uL Final   Comment: SPECIMEN CHECKED FOR CLOTS                            PLATELET COUNT CONFIRMED BY SMEAR  . Neutrophils Relative % 04/03/2014 61  43 - 77 % Final  . Neutro Abs 04/03/2014 3.3  1.7 - 7.7 K/uL Final  . Lymphocytes Relative 04/03/2014 25  12 - 46 % Final  . Lymphs Abs 04/03/2014 1.4  0.7 - 4.0 K/uL Final  . Monocytes Relative 04/03/2014 12  3 - 12 % Final  . Monocytes Absolute 04/03/2014 0.7  0.1 - 1.0 K/uL Final  . Eosinophils Relative 04/03/2014 1  0 - 5 % Final  . Eosinophils Absolute 04/03/2014 0.1  0.0 - 0.7 K/uL Final  . Basophils Relative 04/03/2014 0  0 - 1 % Final  . Basophils Absolute 04/03/2014 0.0  0.0 - 0.1 K/uL Final  . Sodium 04/03/2014 138  137 - 147 mEq/L Final  . Potassium 04/03/2014 3.6* 3.7 - 5.3 mEq/L Final  . Chloride 04/03/2014 100  96 - 112 mEq/L Final  . CO2 04/03/2014 27  19 - 32 mEq/L Final  . Glucose, Bld 04/03/2014 174* 70 - 99 mg/dL Final  . BUN 04/03/2014 17  6 - 23 mg/dL Final  . Creatinine, Ser 04/03/2014 0.99  0.50 - 1.35 mg/dL Final  . Calcium 04/03/2014 9.4  8.4 - 10.5 mg/dL Final  . Total Protein 04/03/2014 7.2  6.0 - 8.3 g/dL Final  . Albumin 04/03/2014 3.5  3.5 - 5.2 g/dL Final  . AST 04/03/2014 23  0 - 37 U/L Final  . ALT 04/03/2014 15  0 - 53 U/L Final  . Alkaline Phosphatase 04/03/2014 87  39 - 117 U/L Final  . Total Bilirubin 04/03/2014 0.5  0.3 - 1.2 mg/dL Final  . GFR calc non Af Amer 04/03/2014 81* >90 mL/min Final  . GFR calc Af Amer 04/03/2014 >90  >90 mL/min Final   Comment: (NOTE)                          The eGFR  has been calculated using the CKD EPI equation.                          This calculation has not been validated in all clinical situations.                          eGFR's persistently <90 mL/min signify possible Chronic Kidney                          Disease.  . PSA 04/03/2014 16.36* <=4.00 ng/mL Final   Comment: (NOTE)                          Test Methodology: ECLIA PSA (Electrochemiluminescence Immunoassay)                          For PSA values from 2.5-4.0, particularly in younger men <60 years                          old, the AUA and NCCN suggest testing for % Free PSA (3515) and                          evaluation of the rate of increase in PSA (PSA velocity).                            Performed at [facility] Outpatient Visit on 03/31/2014  Component Date Value Ref Range Status  . aPTT 03/31/2014 30  24 - 37 seconds Final  . WBC 03/31/2014 4.9  4.0 - 10.5 K/uL Final  . RBC 03/31/2014 3.67* 4.22 - 5.81 MIL/uL Final  . Hemoglobin 03/31/2014 11.0* 13.0 - 17.0 g/dL Final  . HCT 03/31/2014 32.6* 39.0 - 52.0 % Final  . MCV 03/31/2014 88.8  78.0 - 100.0 fL Final  . MCH 03/31/2014 30.0  26.0 - 34.0 pg Final  . MCHC 03/31/2014 33.7  30.0 - 36.0 g/dL Final  . RDW 03/31/2014 13.4  11.5 - 15.5 % Final  . Platelets 03/31/2014 92* 150 - 400 K/uL Final   Comment: SPECIMEN CHECKED FOR CLOTS                          PLATELET COUNT CONFIRMED BY SMEAR  . Neutrophils Relative % 03/31/2014 63  43 - 77 % Final  . Neutro Abs 03/31/2014 3.1  1.7 - 7.7 K/uL Final  . Lymphocytes Relative 03/31/2014 26  12 - 46 % Final  . Lymphs Abs 03/31/2014 1.3  0.7 - 4.0 K/uL Final  . Monocytes Relative 03/31/2014 10  3 - 12 % Final  . Monocytes Absolute 03/31/2014 0.5  0.1 - 1.0 K/uL Final  . Eosinophils Relative 03/31/2014 1  0 - 5 % Final  . Eosinophils Absolute 03/31/2014 0.0  0.0 - 0.7 K/uL Final  . Basophils Relative 03/31/2014 0  0 - 1 % Final  . Basophils Absolute 03/31/2014 0.0  0.0  - 0.1 K/uL Final  . Prothrombin Time 03/31/2014 13.0  11.6 - 15.2 seconds Final  . INR 03/31/2014 1.00  0.00 - 1.49 Final    PATHOLOGY: No new pathology. Gleason"s 4+3=7 Prostate biopsy via ultrasound January 2014 Dr. Clower  Urinalysis    Component Value Date/Time   COLORURINE YELLOW 04/24/2014 0910   APPEARANCEUR CLEAR 04/24/2014 0910   LABSPEC 1.015 04/24/2014 0910   PHURINE 6.0 04/24/2014 0910   GLUCOSEU NEGATIVE 04/24/2014 0910   HGBUR NEGATIVE 04/24/2014 0910   BILIRUBINUR NEGATIVE 04/24/2014 0910   KETONESUR NEGATIVE 04/24/2014 0910   PROTEINUR NEGATIVE 04/24/2014 0910   UROBILINOGEN 0.2 04/24/2014 0910   NITRITE NEGATIVE 04/24/2014 0910   LEUKOCYTESUR NEGATIVE 04/24/2014 0910    RADIOGRAPHIC STUDIES: Dg Chest 2 View  04/24/2014   CLINICAL DATA:  Weakness back pain recent diagnosis of prostate cancer  EXAM: CHEST  2 VIEW  COMPARISON:  None.  FINDINGS: There is a right Port-A-Cath with tip over the cavoatrial junction of the superior vena cava the heart size and vascular pattern are normal. There is mild central bronchitic change. There is no consolidation or effusion.  IMPRESSION: No acute findings   Electronically Signed   By: Skipper Cliche M.D.   On: 04/24/2014 08:28   Ir Fluoro Guide Cv Line Right  03/31/2014   CLINICAL DATA:  Metastatic prostate cancer  EXAM: RIGHT INTERNAL JUGULAR SINGLE LUMEN POWER PORT CATHETER INSERTION  Date:  4/20/20154/20/2015 1:33 PM  Radiologist:  Jerilynn Mages. Daryll Brod, MD  Guidance:  ULTRASOUND AND FLUOROSCOPIC  FLUOROSCOPY TIME:  1 MIN  MEDICATIONS AND MEDICAL HISTORY: 2 g Ancefadministered within 1 hour of the procedure.3 mg Versed, 100 mcg fentanyl  ANESTHESIA/SEDATION: 30 min  CONTRAST:  None.  COMPLICATIONS: No immediate  PROCEDURE: Informed consent was obtained from the patient following explanation of the procedure, risks, benefits and alternatives. The patient  understands, agrees and consents for the procedure. All questions were addressed. A time out was  performed.  Maximal barrier sterile technique utilized including caps, mask, sterile gowns, sterile gloves, large sterile drape, hand hygiene, and 2% chlorhexidine scrub.  Under sterile conditions and local anesthesia, right internal jugular micropuncture venous access was performed. Access was performed with ultrasound. Images were obtained for documentation. A guide wire was inserted followed by a transitional dilator. This allowed insertion of a guide wire and catheter into the IVC. Measurements were obtained from the SVC / RA junction back to the right IJ venotomy site. In the right infraclavicular chest, a subcutaneous pocket was created over the second anterior rib. This was done under sterile conditions and local anesthesia. 1% lidocaine with epinephrine was utilized for this. A 2.5 cm incision was made in the skin. Blunt dissection was performed to create a subcutaneous pocket over the right pectoralis major muscle. The pocket was flushed with saline vigorously. There was adequate hemostasis. The port catheter was assembled and checked for leakage. The port catheter was secured in the pocket with two retention sutures. The tubing was tunneled subcutaneously to the Right venotomy site and inserted into the SVC/RA junction through a valved peel-away sheath. Position was confirmed with fluoroscopy. Images were obtained for documentation. The patient tolerated the procedure well. No immediate complications. Incisions were closed in a two layer fashion with 4 - 0 Vicryl suture. Dermabond was applied to the skin. The port catheter was accessed, blood was aspirated followed by saline and heparin flushes. Needle was removed. A dry sterile dressing was applied.  IMPRESSION: Ultrasound and fluoroscopically guided right internal jugular single lumen power port catheter insertion. Tip in the SVC/RA junction. Catheter ready for use.   Electronically Signed   By: Trevor  Shick M.D.   On: 03/31/2014 13:44   Ir Us Guide  Vasc Access Right  03/31/2014   CLINICAL DATA:  Metastatic prostate cancer  EXAM: RIGHT INTERNAL JUGULAR SINGLE LUMEN POWER PORT CATHETER INSERTION  Date:  4/20/20154/20/2015 1:33 PM  Radiologist:  M. Trevor Shick, MD  Guidance:  ULTRASOUND AND FLUOROSCOPIC  FLUOROSCOPY TIME:  1 MIN  MEDICATIONS AND MEDICAL HISTORY: 2 g Ancefadministered within 1 hour of the procedure.3 mg Versed, 100 mcg fentanyl  ANESTHESIA/SEDATION: 30 min  CONTRAST:  None.  COMPLICATIONS: No immediate  PROCEDURE: Informed consent was obtained from the patient following explanation of the procedure, risks, benefits and alternatives. The patient understands, agrees and consents for the procedure. All questions were addressed. A time out was performed.  Maximal barrier sterile technique utilized including caps, mask, sterile gowns, sterile gloves, large sterile drape, hand hygiene, and 2% chlorhexidine scrub.  Under sterile conditions and local anesthesia, right internal jugular micropuncture venous access was performed. Access was performed with ultrasound. Images were obtained for documentation. A guide wire was inserted followed by a transitional dilator. This allowed insertion of a guide wire and catheter into the IVC. Measurements were obtained from the SVC / RA junction back to the right IJ venotomy site. In the right infraclavicular chest, a subcutaneous pocket was created over the second anterior rib. This was done under sterile conditions and local anesthesia. 1% lidocaine with epinephrine was utilized for this. A 2.5 cm incision was made in the skin. Blunt dissection was performed to create a subcutaneous pocket over the right pectoralis major muscle. The pocket was flushed with saline vigorously. There was adequate hemostasis. The port catheter was assembled and checked for leakage. The port catheter was   secured in the pocket with two retention sutures. The tubing was tunneled subcutaneously to the Right venotomy site and inserted into  the SVC/RA junction through a valved peel-away sheath. Position was confirmed with fluoroscopy. Images were obtained for documentation. The patient tolerated the procedure well. No immediate complications. Incisions were closed in a two layer fashion with 4 - 0 Vicryl suture. Dermabond was applied to the skin. The port catheter was accessed, blood was aspirated followed by saline and heparin flushes. Needle was removed. A dry sterile dressing was applied.  IMPRESSION: Ultrasound and fluoroscopically guided right internal jugular single lumen power port catheter insertion. Tip in the SVC/RA junction. Catheter ready for use.   Electronically Signed   By: Trevor  Shick M.D.   On: 03/31/2014 13:44    ASSESSMENT:  #1. 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  #2. Hot flashes secondary to LHRH agonist therapy plus Casodex. He has been on Casodex for a long time. Improved since discontinuing Casodex.  #3. Hypertension, controlled.  #4. Gastroesophageal reflux disease, controlled.  #5. 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 #6. Deep venous thrombosis right lower extremity, on Eliquis.    PLAN:  #1. Temazepam 15 mg at bedtime. #2. Oxycodone 10-20 mg every 4 hours to control pain. #3. Eliquis 5 mg twice a day. #4. Increase dexamethasone 8 mg each morning #5. Docetaxel 75 mg per meter squared intravenously today followed by Neulasta 6 mg subcutaneously tomorrow. #6. Followup in 3 weeks with CBC, chem profile, PSA, cycle 3 of docetaxel.   All questions were answered. The patient knows to call the clinic with any problems, questions or concerns. We can certainly see the patient much sooner if necessary.   I spent 25 minutes counseling the patient face to face. The total time spent in the appointment was 30 minutes.    Gregory Formanek, MD 04/24/2014 12:04  PM  DISCLAIMER:  This note was dictated with voice recognition software.  Similar sounding words can inadvertently be transcribed inaccurately and may not be corrected upon review.    

## 2014-04-24 NOTE — ED Provider Notes (Signed)
CSN: [id number]     Arrival date & time 04/24/14  4431 History  This chart was scribed for Jacob Speak, MD,  by Stacy Gardner, ED Scribe. The patient was seen in room APA04/APA04 and the patient's care was started at 7:23 AM.   First MD Initiated Contact with Patient 04/24/14 [phone]     Chief Complaint  Patient presents with  . Back Pain     (Consider location/radiation/quality/duration/timing/severity/associated sxs/prior Treatment) HPI HPI Comments: Jacob Mueller is a 73 y.o. male who presents to the Emergency Department complaining of constant moderate bilateral flank pain, onset last night. Pt has the associated symptoms of nausea and less frequent urination. He denies having a BM in the past four days. Pt's daughter report he seems "off" and is not his usual self. Pt is restless at night and irritable during the day. He has tried taking a sleep aide and a "night pill" but that does not seem to help. Pt states that he feels dehydrated and fatigued.  He has a cough. Pt has moderate lower bilateral leg swelling. He was seen at [facility] blood clots to his bilateral legs.  Denies diarrhea, abdominal pain, fever, and vomiting.    Pt has prostate cancer and has his second radiation treatment today.  Past Medical History  Diagnosis Date  . Stroke   . Hypertension   . Coronary artery disease   . Arthritis   . Cancer     Prostate  . Acid reflux disease   . DVT (deep venous thrombosis)     bilateral legs   Past Surgical History  Procedure Laterality Date  . Knee arthroscopy Left   . Prostate biopsy  2014   Family History  Problem Relation Age of Onset  . Diabetes Mother    History  Substance Use Topics  . Smoking status: Former Research scientist (life sciences)  . Smokeless tobacco: Never Used  . Alcohol Use: No    Review of Systems  Constitutional: Positive for fatigue. Negative for fever.  Respiratory: Positive for cough.   Gastrointestinal: Positive for nausea and constipation. Negative  for vomiting and abdominal pain.  Genitourinary: Positive for flank pain and decreased urine volume.  Psychiatric/Behavioral: Positive for sleep disturbance and agitation.  All other systems reviewed and are negative.     Allergies  Claritin  Home Medications   Prior to Admission medications   Medication Sig Start Date End Date Taking? Authorizing Provider  amLODipine (NORVASC) 10 MG tablet Take 20 mg by mouth daily.     Historical Provider, MD  apixaban (ELIQUIS) 5 MG TABS tablet Take 5 mg by mouth 2 (two) times daily.    Historical Provider, MD  aspirin EC 81 MG tablet Take 81 mg by mouth daily.    Historical Provider, MD  clopidogrel (PLAVIX) 75 MG tablet Take 75 mg by mouth daily.    Historical Provider, MD  dexamethasone (DECADRON) 4 MG tablet Take 4 mg by mouth 2 (two) times daily.  03/06/14   Farrel Gobble, MD  docusate sodium (COLACE) 100 MG capsule Take 100 mg by mouth daily.    Historical Provider, MD  Doxylamine Succinate, Sleep, (SLEEP AID PO) Take 1 tablet by mouth at bedtime as needed.    Historical Provider, MD  furosemide (LASIX) 40 MG tablet Take 40 mg by mouth daily.    Historical Provider, MD  hydrALAZINE (APRESOLINE) 50 MG tablet Take 50 mg by mouth 2 (two) times daily.    Historical Provider, MD  leuprolide (LUPRON) 11.25 MG  injection Inject 11.25 mg into the muscle every 6 (six) months.    Historical Provider, MD  lidocaine-prilocaine (EMLA) cream Apply a quarter size amount to port site 1 hour prior to chemo. Do not rub in. Cover with plastic wrap. 03/13/14   Farrel Gobble, MD  lisinopril (PRINIVIL,ZESTRIL) 40 MG tablet Take 40 mg by mouth daily.    Historical Provider, MD  loperamide (IMODIUM A-D) 2 MG tablet Take 2 mg by mouth 4 (four) times daily as needed for diarrhea or loose stools.    Historical Provider, MD  metoCLOPramide (REGLAN) 5 MG tablet The day after chemo take 1 tablet four times a day x 48 hours. Then may take 1 tab four times a day if needed for  nausea/vomiting. 03/13/14   Farrel Gobble, MD  Multiple Minerals-Vitamins (PROSTEON PO) Take 2 tablets by mouth daily. Bone supplement    Historical Provider, MD  Multiple Vitamins-Minerals (MULTIVITAMINS THER. W/MINERALS) TABS tablet Take 1 tablet by mouth daily.    Historical Provider, MD  ondansetron (ZOFRAN ODT) 4 MG disintegrating tablet 4mg  ODT q4 hours prn nausea/vomit 04/07/14   Maudry Diego, MD  oxyCODONE (OXY IR/ROXICODONE) 5 MG immediate release tablet Take 20-25 mg by mouth every 4 (four) hours as needed. Take 2-3 tablets every 4 hours as needed for pain 04/03/14   Farrel Gobble, MD  oxyCODONE-acetaminophen (PERCOCET/ROXICET) 5-325 MG per tablet Take 1 tablet by mouth every 6 (six) hours as needed for severe pain. 04/07/14   Maudry Diego, MD  prochlorperazine (COMPAZINE) 10 MG tablet The day after chemo take 1 tablet four times a day x 48 hours. Then may take 1 tab four times a day if needed for nausea/vomiting. 03/13/14   Farrel Gobble, MD  ranitidine (ZANTAC) 150 MG tablet Take 150 mg by mouth 2 (two) times daily as needed for heartburn (may take up to 4 times daily as needed). Can take up to four times a day if needed    Historical Provider, MD  senna (SENOKOT) 8.6 MG tablet Take 2 tablets by mouth at bedtime.     Historical Provider, MD  simvastatin (ZOCOR) 20 MG tablet Take 20 mg by mouth daily.    Historical Provider, MD   BP 166/79  Pulse 80  Temp(Src) 98.2 F (36.8 C) (Oral)  Resp 16  Ht 5\' 9"  (1.753 m)  Wt 223 lb (101.152 kg)  BMI 32.92 kg/m2  SpO2 100% Physical Exam  Nursing note and vitals reviewed. Constitutional: He is oriented to person, place, and time. He appears well-developed and well-nourished. No distress.  HENT:  Head: Normocephalic.  Eyes: EOM are normal. Pupils are equal, round, and reactive to light.  Neck: Normal range of motion.  Cardiovascular: Normal rate, regular rhythm and normal heart sounds.  Exam reveals no gallop and no friction rub.    No murmur heard. Pulmonary/Chest: Effort normal and breath sounds normal. No respiratory distress. He has no wheezes. He has no rales.  Musculoskeletal: Normal range of motion. He exhibits edema.  Right lower extremity  Is noted to have 3+ pitting edema  Small area of granulation tissue to the anterior aspect w/o redness or purulent drainage   Neurological: He is alert and oriented to person, place, and time. No cranial nerve deficit. He exhibits normal muscle tone. Coordination normal.  Skin: Skin is warm and dry. He is not diaphoretic.  Psychiatric: He has a normal mood and affect. His behavior is normal.    ED Course  Procedures (including critical  care time) DIAGNOSTIC STUDIES: Oxygen Saturation is 100% on room air, normal by my interpretation.    COORDINATION OF CARE:  7:26 AM Discussed course of care with pt . Pt understands and agrees.    Labs Review Labs Reviewed  CBC WITH DIFFERENTIAL - Abnormal; Notable for the following:    WBC 12.2 (*)    RBC 3.30 (*)    Hemoglobin 10.0 (*)    HCT 29.6 (*)    Neutrophils Relative % 78 (*)    Neutro Abs 9.5 (*)    Monocytes Absolute 1.1 (*)    All other components within normal limits  COMPREHENSIVE METABOLIC PANEL  URINALYSIS, ROUTINE W REFLEX MICROSCOPIC  LIPASE, BLOOD    Imaging Review No results found.   EKG Interpretation None      MDM   Final diagnoses:  None    Patient is a 73 year old male currently undergoing chemotherapy for prostate cancer. He presents today with complaints of not feeling well believes he may be dehydrated. His physical examination is unremarkable and abdomen is benign. There is no tenderness to palpation. Workup reveals a mildly elevated white count of 12,000 electrolytes are essentially unremarkable. He is given 1 L of normal saline. At this point I feel as though he is stable for his chemotherapy treatment which she is due for at 9:30. He will be discharged from here to the [facility].  I personally performed the services described in this documentation, which was scribed in my presence. The recorded information has been reviewed and is accurate.       Jacob Speak, MD 04/24/14 [phone]

## 2014-04-24 NOTE — Discharge Instructions (Signed)
Return to the ER if you develop high fever, abdominal pain, or any other new and concerning symptoms.   Weakness Weakness is a lack of strength. It may be felt all over the body (generalized) or in one specific part of the body (focal). Some causes of weakness can be serious. You may need further medical evaluation, especially if you are elderly or you have a history of immunosuppression (such as chemotherapy or HIV), kidney disease, heart disease, or diabetes. CAUSES  Weakness can be caused by many different things, including:  Infection.  Physical exhaustion.  Internal bleeding or other blood loss that results in a lack of red blood cells (anemia).  Dehydration. This cause is more common in elderly people.  Side effects or electrolyte abnormalities from medicines, such as pain medicines or sedatives.  Emotional distress, anxiety, or depression.  Circulation problems, especially severe peripheral arterial disease.  Heart disease, such as rapid atrial fibrillation, bradycardia, or heart failure.  Nervous system disorders, such as Guillain-Barr syndrome, multiple sclerosis, or stroke. DIAGNOSIS  To find the cause of your weakness, your caregiver will take your history and perform a physical exam. Lab tests or X-rays may also be ordered, if needed. TREATMENT  Treatment of weakness depends on the cause of your symptoms and can vary greatly. HOME CARE INSTRUCTIONS   Rest as needed.  Eat a well-balanced diet.  Try to get some exercise every day.  Only take over-the-counter or prescription medicines as directed by your caregiver. SEEK MEDICAL CARE IF:   Your weakness seems to be getting worse or spreads to other parts of your body.  You develop new aches or pains. SEEK IMMEDIATE MEDICAL CARE IF:   You cannot perform your normal daily activities, such as getting dressed and feeding yourself.  You cannot walk up and down stairs, or you feel exhausted when you do so.  You  have shortness of breath or chest pain.  You have difficulty moving parts of your body.  You have weakness in only one area of the body or on only one side of the body.  You have a fever.  You have trouble speaking or swallowing.  You cannot control your bladder or bowel movements.  You have black or bloody vomit or stools. MAKE SURE YOU:  Understand these instructions.  Will watch your condition.  Will get help right away if you are not doing well or get worse. Document Released: 11/28/2005 Document Revised: 05/29/2012 Document Reviewed: 01/27/2012 [facility] Patient Information 2014 [place].

## 2014-04-24 NOTE — ED Notes (Signed)
Complain of pain in both sides that started last night. Denies other symptoms

## 2014-04-24 NOTE — Progress Notes (Signed)
..  Jacob Mueller arrives for chemo visit after ED visti this am for pain, weakness, and thinking he was "dehydrated". See ED note and oncology note. Patient denies pain at this time. Tolerated chemo well.

## 2014-04-24 NOTE — ED Notes (Signed)
MD at bedside. 

## 2014-04-25 ENCOUNTER — Encounter (HOSPITAL_BASED_OUTPATIENT_CLINIC_OR_DEPARTMENT_OTHER): Payer: MEDICARE

## 2014-04-25 VITALS — BP 133/69 | HR 91 | Temp 98.5°F

## 2014-04-25 DIAGNOSIS — C7951 Secondary malignant neoplasm of bone: Secondary | ICD-10-CM

## 2014-04-25 DIAGNOSIS — C7952 Secondary malignant neoplasm of bone marrow: Secondary | ICD-10-CM

## 2014-04-25 DIAGNOSIS — Z5189 Encounter for other specified aftercare: Secondary | ICD-10-CM

## 2014-04-25 DIAGNOSIS — C61 Malignant neoplasm of prostate: Secondary | ICD-10-CM

## 2014-04-25 LAB — PSA: PSA: 11.29 ng/mL — ABNORMAL HIGH (ref <=4.00)

## 2014-04-25 MED ORDER — PEGFILGRASTIM INJECTION 6 MG/0.6ML
SUBCUTANEOUS | Status: AC
  Filled 2014-04-25: qty 0.6

## 2014-04-25 MED ORDER — PEGFILGRASTIM INJECTION 6 MG/0.6ML
6.0000 mg | Freq: Once | SUBCUTANEOUS | Status: AC
  Administered 2014-04-25: 6 mg via SUBCUTANEOUS

## 2014-04-25 NOTE — Progress Notes (Signed)
Bee Spittler presents today for injection per MD orders. Neulasta 6mg administered SQ in left Abdomen. Administration without incident. Patient tolerated well.  

## 2014-05-07 ENCOUNTER — Other Ambulatory Visit (HOSPITAL_COMMUNITY): Payer: Self-pay | Admitting: *Deleted

## 2014-05-07 MED ORDER — APIXABAN 5 MG PO TABS: 5.0000 mg | ORAL_TABLET | Freq: Two times a day (BID) | ORAL | Status: DC

## 2014-05-12 ENCOUNTER — Telehealth (HOSPITAL_COMMUNITY): Payer: Self-pay | Admitting: *Deleted

## 2014-05-12 ENCOUNTER — Encounter (HOSPITAL_COMMUNITY): Payer: Self-pay | Admitting: *Deleted

## 2014-05-12 NOTE — Telephone Encounter (Signed)
First hospitalization in [place] was 2 weeks ago. Dx Cellulitis in RLE and white blood cell count of 42,000. He had IV antibiotics and then discharged.   Patient was hospitalized towards the end of last week and was discharged on Saturday from [place], [place] at hospital there. 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 Daughter states that the PE wasn't in the central part of his lungs. Primary care doctors Horris Latino Burnham/ Latanya Maudlin  [phone] in [place] wants to know if he is a candidate for a filter. Primary care doctors are wondering is the PE from health failure or cancer etc?

## 2014-05-12 NOTE — Telephone Encounter (Signed)
Dr. 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 Daughter thinks it is the left side of neck. They noticed this when patient was hospitalized a couple of weeks ago (approx May 15). Primary MDs want you to take a look at it.   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 Hearing test last performed in [facility] @ ENT within last year.

## 2014-05-15 ENCOUNTER — Encounter (HOSPITAL_COMMUNITY): Payer: MEDICARE | Attending: Hematology and Oncology

## 2014-05-15 ENCOUNTER — Encounter (HOSPITAL_COMMUNITY): Payer: Self-pay

## 2014-05-15 ENCOUNTER — Encounter (HOSPITAL_BASED_OUTPATIENT_CLINIC_OR_DEPARTMENT_OTHER): Payer: MEDICARE

## 2014-05-15 VITALS — BP 103/53 | HR 72 | Temp 99.1°F | Resp 18 | Wt 222.0 lb

## 2014-05-15 DIAGNOSIS — R232 Flushing: Secondary | ICD-10-CM

## 2014-05-15 DIAGNOSIS — R599 Enlarged lymph nodes, unspecified: Secondary | ICD-10-CM | POA: Insufficient documentation

## 2014-05-15 DIAGNOSIS — C7952 Secondary malignant neoplasm of bone marrow: Secondary | ICD-10-CM

## 2014-05-15 DIAGNOSIS — C7951 Secondary malignant neoplasm of bone: Secondary | ICD-10-CM | POA: Insufficient documentation

## 2014-05-15 DIAGNOSIS — R590 Localized enlarged lymph nodes: Secondary | ICD-10-CM

## 2014-05-15 DIAGNOSIS — D6959 Other secondary thrombocytopenia: Secondary | ICD-10-CM

## 2014-05-15 DIAGNOSIS — Z5111 Encounter for antineoplastic chemotherapy: Secondary | ICD-10-CM

## 2014-05-15 DIAGNOSIS — I82409 Acute embolism and thrombosis of unspecified deep veins of unspecified lower extremity: Secondary | ICD-10-CM

## 2014-05-15 DIAGNOSIS — D6182 Myelophthisis: Secondary | ICD-10-CM

## 2014-05-15 DIAGNOSIS — L988 Other specified disorders of the skin and subcutaneous tissue: Secondary | ICD-10-CM | POA: Insufficient documentation

## 2014-05-15 DIAGNOSIS — C61 Malignant neoplasm of prostate: Secondary | ICD-10-CM | POA: Insufficient documentation

## 2014-05-15 DIAGNOSIS — E1369 Other specified diabetes mellitus with other specified complication: Secondary | ICD-10-CM | POA: Insufficient documentation

## 2014-05-15 DIAGNOSIS — I8010 Phlebitis and thrombophlebitis of unspecified femoral vein: Secondary | ICD-10-CM | POA: Insufficient documentation

## 2014-05-15 DIAGNOSIS — I2699 Other pulmonary embolism without acute cor pulmonale: Secondary | ICD-10-CM

## 2014-05-15 DIAGNOSIS — I82411 Acute embolism and thrombosis of right femoral vein: Secondary | ICD-10-CM

## 2014-05-15 LAB — COMPREHENSIVE METABOLIC PANEL
ALT: 15 U/L (ref 0–53)
AST: 16 U/L (ref 0–37)
Albumin: 2.7 g/dL — ABNORMAL LOW (ref 3.5–5.2)
Alkaline Phosphatase: 82 U/L (ref 39–117)
BUN: 12 mg/dL (ref 6–23)
CO2: 24 meq/L (ref 19–32)
CREATININE: 0.7 mg/dL (ref 0.50–1.35)
Calcium: 8.7 mg/dL (ref 8.4–10.5)
Chloride: 100 mEq/L (ref 96–112)
GLUCOSE: 188 mg/dL — AB (ref 70–99)
Potassium: 3.4 mEq/L — ABNORMAL LOW (ref 3.7–5.3)
Sodium: 137 mEq/L (ref 137–147)
Total Bilirubin: 0.9 mg/dL (ref 0.3–1.2)
Total Protein: 5.9 g/dL — ABNORMAL LOW (ref 6.0–8.3)

## 2014-05-15 LAB — CBC WITH DIFFERENTIAL/PLATELET
Basophils Absolute: 0 10*3/uL (ref 0.0–0.1)
Basophils Relative: 0 % (ref 0–1)
EOS PCT: 0 % (ref 0–5)
Eosinophils Absolute: 0 10*3/uL (ref 0.0–0.7)
HEMATOCRIT: 26.7 % — AB (ref 39.0–52.0)
Hemoglobin: 9 g/dL — ABNORMAL LOW (ref 13.0–17.0)
LYMPHS ABS: 0.6 10*3/uL — AB (ref 0.7–4.0)
LYMPHS PCT: 4 % — AB (ref 12–46)
MCH: 31.3 pg (ref 26.0–34.0)
MCHC: 33.7 g/dL (ref 30.0–36.0)
MCV: 92.7 fL (ref 78.0–100.0)
MONO ABS: 0.8 10*3/uL (ref 0.1–1.0)
Monocytes Relative: 5 % (ref 3–12)
Neutro Abs: 14.6 10*3/uL — ABNORMAL HIGH (ref 1.7–7.7)
Neutrophils Relative %: 91 % — ABNORMAL HIGH (ref 43–77)
Platelets: 173 10*3/uL (ref 150–400)
RBC: 2.88 MIL/uL — AB (ref 4.22–5.81)
RDW: 17.3 % — ABNORMAL HIGH (ref 11.5–15.5)
WBC: 16 10*3/uL — ABNORMAL HIGH (ref 4.0–10.5)

## 2014-05-15 MED ORDER — HEPARIN SOD (PORK) LOCK FLUSH 100 UNIT/ML IV SOLN
500.0000 [IU] | Freq: Once | INTRAVENOUS | Status: AC | PRN
  Administered 2014-05-15: 500 [IU]

## 2014-05-15 MED ORDER — SODIUM CHLORIDE 0.9 % IV SOLN: 8.0000 mg | Freq: Once | INTRAVENOUS | Status: DC

## 2014-05-15 MED ORDER — HEPARIN SOD (PORK) LOCK FLUSH 100 UNIT/ML IV SOLN
INTRAVENOUS | Status: AC
  Filled 2014-05-15: qty 5

## 2014-05-15 MED ORDER — SODIUM CHLORIDE 0.9 % IV SOLN
Freq: Once | INTRAVENOUS | Status: AC
  Administered 2014-05-15: 11:00:00 via INTRAVENOUS

## 2014-05-15 MED ORDER — OXYCODONE HCL 10 MG PO TABS: ORAL_TABLET | ORAL | Status: AC

## 2014-05-15 MED ORDER — SODIUM CHLORIDE 0.9 % IV SOLN
Freq: Once | INTRAVENOUS | Status: AC
  Administered 2014-05-15: 8 mg via INTRAVENOUS
  Filled 2014-05-15: qty 4

## 2014-05-15 MED ORDER — DOCETAXEL CHEMO INJECTION 160 MG/16ML
75.0000 mg/m2 | Freq: Once | INTRAVENOUS | Status: AC
  Administered 2014-05-15: 170 mg via INTRAVENOUS
  Filled 2014-05-15: qty 17

## 2014-05-15 MED ORDER — SODIUM CHLORIDE 0.9 % IJ SOLN: 10.0000 mL | INTRAMUSCULAR | Status: DC | PRN

## 2014-05-15 MED ORDER — ENOXAPARIN SODIUM 100 MG/ML ~~LOC~~ SOLN: 100.0000 mg | Freq: Two times a day (BID) | SUBCUTANEOUS | Status: AC

## 2014-05-15 MED ORDER — DEXAMETHASONE SODIUM PHOSPHATE 10 MG/ML IJ SOLN: 10.0000 mg | Freq: Once | INTRAMUSCULAR | Status: DC

## 2014-05-15 NOTE — Progress Notes (Signed)
Tolerated infusion

## 2014-05-15 NOTE — Patient Instructions (Signed)
[facility] Discharge Instructions  RECOMMENDATIONS MADE BY THE CONSULTANT AND ANY TEST RESULTS WILL BE SENT TO YOUR REFERRING PHYSICIAN.   Docetaxel intravenously today followed by Neulasta tomorrow.   Lovenox 100 mg subcutaneously every 12 hours. Continue eliquis if Lovenox is on affordable.   Followup in 3 weeks with CBC, chem profile, PSA.   Thank you for choosing [facility] to provide your oncology and hematology care.  To afford each patient quality time with our providers, please arrive at least 15 minutes before your scheduled appointment time.  With your help, our goal is to use those 15 minutes to complete the necessary work-up to ensure our physicians have the information they need to help with your evaluation and healthcare recommendations.    Effective January 1st, 2014, we ask that you re-schedule your appointment with our physicians should you arrive 10 or more minutes late for your appointment.  We strive to give you quality time with our providers, and arriving late affects you and other patients whose appointments are after yours.    Again, thank you for choosing [facility].  Our hope is that these requests will decrease the amount of time that you wait before being seen by our physicians.       _____________________________________________________________  Should you have questions after your visit to [facility], please contact our office at [phone] between the hours of 8:30 a.m. and 5:00 p.m.  Voicemails left after 4:30 p.m. will not be returned until the following business day.  For prescription refill requests, have your pharmacy contact our office with your prescription refill request.

## 2014-05-15 NOTE — Progress Notes (Signed)
[name]  OFFICE PROGRESS NOTE  No primary provider on file. No primary provider on file.  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 Bilateral lower extremity DVTs  . Date for prostate cancer with lymph node and bone metastases    CURRENT THERAPY: Docetaxel IV every 3 weeks, for cycle #3 today, leuprolide every 6 months given by urologist in January 2015.  INTERVAL HISTORY: Jacob Mueller 73 y.o. 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  He was in the emergency room in [place] found to have very embolism. Deep venous thromboses in both lower extremities persist. He continues on eliquis 5 mg twice a day. He does develop some puffiness involving the left supraclavicular area without nausea, vomiting, but with left-sided chest pain. He denies any breast pain. Bowel movements are regular with no PND, orthopnea, or palpitations. He denies any worsening lower extremity swelling or redness and no skin breakdown.  MEDICAL HISTORY: Past Medical History  Diagnosis Date  . Stroke   . Hypertension   . Coronary artery disease   . Arthritis   . Cancer     Prostate  . Acid reflux disease   . 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 PSA on 01/30/2014 was 18.48 Developed RLE swelling with pain and diagnosed in [place] with DVT, currently on Eliquis 63m BID. Lumbar spine MRI showed epidural disease treated with RT for 10 treatments ending on 03/24/2014 in [place] Casodex discontinued as a systemic therapeutic maneuver 03/12/2014. Chemotherapy with Docetaxel started 04/03/2014.  ALLERGIES:  is allergic to claritin.  MEDICATIONS: has a current medication list which includes the following prescription(s): amlodipine, apixaban, aspirin ec, benzonatate, dexamethasone, docusate sodium, doxylamine succinate (sleep), furosemide, leuprolide, lidocaine-prilocaine, lisinopril, loperamide, metoclopramide, multivitamins ther. w/minerals, ondansetron, oxycodone hcl, prochlorperazine, ranitidine, senna, sertraline, simvastatin, clopidogrel, enoxaparin, hydralazine, multiple minerals-vitamins, and temazepam, and the following Facility-Administered Medications: sodium chloride, DOCEtaxel (TAXOTERE) 170 mg in dextrose 5 % 250 mL chemo infusion, heparin lock flush, ondansetron (ZOFRAN) 8 mg, dexamethasone (DECADRON) 10 mg in sodium chloride 0.9 % 50 mL IVPB, and sodium chloride.  SURGICAL HISTORY:  Past Surgical History  Procedure Laterality Date  . Knee arthroscopy Left   . Prostate biopsy  2014    FAMILY HISTORY: family history includes Diabetes in his mother.  SOCIAL HISTORY:  reports that he has quit smoking. He has never used smokeless tobacco. He reports that he does not drink alcohol or use illicit drugs.  REVIEW OF SYSTEMS:  Other than that discussed above is noncontributory.  PHYSICAL EXAMINATION: ECOG PERFORMANCE STATUS: 3 - Symptomatic, >50% confined to bed  Blood pressure 103/53, pulse 72, temperature 99.1 F (37.3 C), temperature source Oral, resp. rate 18, weight 222 lb (100.699 kg).  GENERAL:alert, no distress and comfortable SKIN: skin color, texture, turgor are  normal, no rashes  or significant lesions EYES: PERLA; Conjunctiva are pink and non-injected, sclera clear SINUSES: No redness or tenderness over maxillary or ethmoid sinuses OROPHARYNX:no exudate, no erythema on lips, buccal mucosa, or tongue. NECK: supple, thyroid normal size, non-tender, without nodularity. No masses CHEST: Increased AP diameter with no friction rub. Bilateral gynecomastia. LYMPH:  no palpable lymphadenopathy in the cervical, axillary or inguinal LUNGS: clear to auscultation and percussion with normal breathing effort HEART: regular rate & rhythm and no murmurs. ABDOMEN:abdomen soft, non-tender and normal bowel sounds MUSCULOSKELETAL:no cyanosis of digits and no clubbing. Bilateral lower extremity swelling with positive Homans sign.  NEURO: alert & oriented x 3 with fluent speech, hearing deficit with plans to get a hearing aid on the right.   LABORATORY DATA: Infusion on 05/15/2014  Component Date Value Ref Range Status  . WBC 05/15/2014 16.0* 4.0 - 10.5 K/uL Final  . RBC 05/15/2014 2.88* 4.22 - 5.81 MIL/uL Final  . Hemoglobin 05/15/2014 9.0* 13.0 - 17.0 g/dL Final  . HCT 05/15/2014 26.7* 39.0 - 52.0 % Final  . MCV 05/15/2014 92.7  78.0 - 100.0 fL Final  . MCH 05/15/2014 31.3  26.0 - 34.0 pg Final  . MCHC 05/15/2014 33.7  30.0 - 36.0 g/dL Final  . RDW 05/15/2014 17.3* 11.5 - 15.5 % Final  . Platelets 05/15/2014 173  150 - 400 K/uL Final  . Neutrophils Relative % 05/15/2014 91* 43 - 77 % Final  . Neutro Abs 05/15/2014 14.6* 1.7 - 7.7 K/uL Final  . Lymphocytes Relative 05/15/2014 4* 12 - 46 % Final  . Lymphs Abs 05/15/2014 0.6* 0.7 - 4.0 K/uL Final  . Monocytes Relative 05/15/2014 5  3 - 12 % Final  . Monocytes Absolute 05/15/2014 0.8  0.1 - 1.0 K/uL Final  . Eosinophils Relative 05/15/2014 0  0 - 5 % Final  . Eosinophils Absolute 05/15/2014 0.0  0.0 - 0.7 K/uL Final  . Basophils Relative 05/15/2014 0  0 - 1 % Final  . Basophils Absolute 05/15/2014 0.0  0.0 - 0.1 K/uL Final  .  Sodium 05/15/2014 137  137 - 147 mEq/L Final  . Potassium 05/15/2014 3.4* 3.7 - 5.3 mEq/L Final  . Chloride 05/15/2014 100  96 - 112 mEq/L Final  . CO2 05/15/2014 24  19 - 32 mEq/L Final  . Glucose, Bld 05/15/2014 188* 70 - 99 mg/dL Final  . BUN 05/15/2014 12  6 - 23 mg/dL Final  . Creatinine, Ser 05/15/2014 0.70  0.50 - 1.35 mg/dL Final  . Calcium 05/15/2014 8.7  8.4 - 10.5 mg/dL Final  . Total Protein 05/15/2014 5.9* 6.0 - 8.3 g/dL Final  . Albumin 05/15/2014 2.7* 3.5 - 5.2 g/dL Final  . AST 05/15/2014 16  0 - 37 U/L Final  . ALT 05/15/2014 15  0 - 53 U/L Final  . Alkaline Phosphatase 05/15/2014 82  39 - 117 U/L Final  . Total Bilirubin 05/15/2014 0.9  0.3 - 1.2 mg/dL Final  . GFR calc non Af Amer 05/15/2014 >90  >90 mL/min Final  . GFR calc Af Amer 05/15/2014 >90  >90 mL/min Final   Comment: (NOTE)                          The eGFR has been calculated using the CKD EPI equation.                          This  calculation has not been validated in all clinical situations.                          eGFR's persistently <90 mL/min signify possible Chronic Kidney                          Disease.  Admission on 04/24/2014, Discharged on 04/24/2014  Component Date Value Ref Range Status  . WBC 04/24/2014 12.2* 4.0 - 10.5 K/uL Final  . RBC 04/24/2014 3.30* 4.22 - 5.81 MIL/uL Final  . Hemoglobin 04/24/2014 10.0* 13.0 - 17.0 g/dL Final  . HCT 04/24/2014 29.6* 39.0 - 52.0 % Final  . MCV 04/24/2014 89.7  78.0 - 100.0 fL Final  . MCH 04/24/2014 30.3  26.0 - 34.0 pg Final  . MCHC 04/24/2014 33.8  30.0 - 36.0 g/dL Final  . RDW 04/24/2014 15.3  11.5 - 15.5 % Final  . Platelets 04/24/2014 255  150 - 400 K/uL Final  . Neutrophils Relative % 04/24/2014 78* 43 - 77 % Final  . Neutro Abs 04/24/2014 9.5* 1.7 - 7.7 K/uL Final  . Lymphocytes Relative 04/24/2014 13  12 - 46 % Final  . Lymphs Abs 04/24/2014 1.6  0.7 - 4.0 K/uL Final  . Monocytes Relative 04/24/2014 9  3 - 12 % Final  . Monocytes Absolute  04/24/2014 1.1* 0.1 - 1.0 K/uL Final  . Eosinophils Relative 04/24/2014 0  0 - 5 % Final  . Eosinophils Absolute 04/24/2014 0.1  0.0 - 0.7 K/uL Final  . Basophils Relative 04/24/2014 0  0 - 1 % Final  . Basophils Absolute 04/24/2014 0.0  0.0 - 0.1 K/uL Final  . Sodium 04/24/2014 138  137 - 147 mEq/L Final  . Potassium 04/24/2014 3.4* 3.7 - 5.3 mEq/L Final  . Chloride 04/24/2014 100  96 - 112 mEq/L Final  . CO2 04/24/2014 27  19 - 32 mEq/L Final  . Glucose, Bld 04/24/2014 185* 70 - 99 mg/dL Final  . BUN 04/24/2014 13  6 - 23 mg/dL Final  . Creatinine, Ser 04/24/2014 0.73  0.50 - 1.35 mg/dL Final  . Calcium 04/24/2014 9.2  8.4 - 10.5 mg/dL Final  . Total Protein 04/24/2014 6.6  6.0 - 8.3 g/dL Final  . Albumin 04/24/2014 3.1* 3.5 - 5.2 g/dL Final  . AST 04/24/2014 19  0 - 37 U/L Final  . ALT 04/24/2014 19  0 - 53 U/L Final  . Alkaline Phosphatase 04/24/2014 90  39 - 117 U/L Final  . Total Bilirubin 04/24/2014 0.5  0.3 - 1.2 mg/dL Final  . GFR calc non Af Amer 04/24/2014 >90  >90 mL/min Final  . GFR calc Af Amer 04/24/2014 >90  >90 mL/min Final   Comment: (NOTE)                          The eGFR has been calculated using the CKD EPI equation.                          This calculation has not been validated in all clinical situations.                          eGFR's persistently <90 mL/min signify possible Chronic Kidney  Disease.  . Color, Urine 04/24/2014 YELLOW  YELLOW Final  . APPearance 04/24/2014 CLEAR  CLEAR Final  . Specific Gravity, Urine 04/24/2014 1.015  1.005 - 1.030 Final  . pH 04/24/2014 6.0  5.0 - 8.0 Final  . Glucose, UA 04/24/2014 NEGATIVE  NEGATIVE mg/dL Final  . Hgb urine dipstick 04/24/2014 NEGATIVE  NEGATIVE Final  . Bilirubin Urine 04/24/2014 NEGATIVE  NEGATIVE Final  . Ketones, ur 04/24/2014 NEGATIVE  NEGATIVE mg/dL Final  . Protein, ur 04/24/2014 NEGATIVE  NEGATIVE mg/dL Final  . Urobilinogen, UA 04/24/2014 0.2  0.0 - 1.0 mg/dL Final  .  Nitrite 04/24/2014 NEGATIVE  NEGATIVE Final  . Leukocytes, UA 04/24/2014 NEGATIVE  NEGATIVE Final   MICROSCOPIC NOT DONE ON URINES WITH NEGATIVE PROTEIN, BLOOD, LEUKOCYTES, NITRITE, OR GLUCOSE <1000 mg/dL.  . Lipase 04/24/2014 30  11 - 59 U/L Final  Infusion on 04/24/2014  Component Date Value Ref Range Status  . PSA 04/24/2014 11.29* <=4.00 ng/mL Final   Comment: (NOTE)                          Test Methodology: ECLIA PSA (Electrochemiluminescence Immunoassay)                          For PSA values from 2.5-4.0, particularly in younger men <60 years                          old, the AUA and NCCN suggest testing for % Free PSA (3515) and                          evaluation of the rate of increase in PSA (PSA velocity).                          Performed at [facility]: Adenocarcinoma Gleason score 4+3 =7  Urinalysis    Component Value Date/Time   COLORURINE YELLOW 04/24/2014 0910   APPEARANCEUR CLEAR 04/24/2014 0910   LABSPEC 1.015 04/24/2014 0910   PHURINE 6.0 04/24/2014 0910   GLUCOSEU NEGATIVE 04/24/2014 0910   HGBUR NEGATIVE 04/24/2014 0910   BILIRUBINUR NEGATIVE 04/24/2014 0910   KETONESUR NEGATIVE 04/24/2014 0910   PROTEINUR NEGATIVE 04/24/2014 0910   UROBILINOGEN 0.2 04/24/2014 0910   NITRITE NEGATIVE 04/24/2014 0910   LEUKOCYTESUR NEGATIVE 04/24/2014 0910    RADIOGRAPHIC STUDIES: Dg Chest 2 View  04/24/2014   CLINICAL DATA:  Weakness back pain recent diagnosis of prostate cancer  EXAM: CHEST  2 VIEW  COMPARISON:  None.  FINDINGS: There is a right Port-A-Cath with tip over the cavoatrial junction of the superior vena cava the heart size and vascular pattern are normal. There is mild central bronchitic change. There is no consolidation or effusion.  IMPRESSION: No acute findings   Electronically Signed   By: Skipper Cliche M.D.   On: 04/24/2014 08:28    ASSESSMENT:  #1. 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  #2. Hot flashes secondary to [facility] agonist therapy plus Casodex. He has been on Casodex for a long time. Improved since discontinuing Casodex.  #3. Hypertension, controlled.  #4. Gastroesophageal reflux disease, controlled.  #5. 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  #6. Deep venous thrombosis bilateral lower extremities  while on Eliquis, now with pulmonary embolism.    PLAN:  #1. Docetaxel intravenously today followed by Neulasta tomorrow. #2. Lovenox 100 mg subcutaneously every 12 hours. Continue eliquis if Lovenox is on affordable. #3. Followup in 3 weeks with CBC, chem profile, PSA.   All questions were answered. The patient knows to call the clinic with any problems, questions or concerns. We can certainly see the patient much sooner if necessary.   I spent 40 minutes counseling the patient face to face. The total time spent in the appointment was 55 minutes.    Farrel Gobble, MD 05/15/2014 11:09 AM  DISCLAIMER:  This note was dictated with voice recognition software.  Similar sounding words can inadvertently be transcribed inaccurately and may not be corrected upon review.

## 2014-05-16 ENCOUNTER — Encounter (HOSPITAL_BASED_OUTPATIENT_CLINIC_OR_DEPARTMENT_OTHER): Payer: MEDICARE

## 2014-05-16 VITALS — BP 116/65 | HR 82 | Temp 98.1°F | Resp 18

## 2014-05-16 DIAGNOSIS — C7951 Secondary malignant neoplasm of bone: Secondary | ICD-10-CM

## 2014-05-16 DIAGNOSIS — C61 Malignant neoplasm of prostate: Secondary | ICD-10-CM

## 2014-05-16 DIAGNOSIS — C7952 Secondary malignant neoplasm of bone marrow: Secondary | ICD-10-CM

## 2014-05-16 DIAGNOSIS — Z5189 Encounter for other specified aftercare: Secondary | ICD-10-CM

## 2014-05-16 LAB — PSA: PSA: 12.15 ng/mL — ABNORMAL HIGH (ref <=4.00)

## 2014-05-16 MED ORDER — PEGFILGRASTIM INJECTION 6 MG/0.6ML
6.0000 mg | Freq: Once | SUBCUTANEOUS | Status: AC
  Administered 2014-05-16: 6 mg via SUBCUTANEOUS

## 2014-05-16 MED ORDER — PEGFILGRASTIM INJECTION 6 MG/0.6ML
SUBCUTANEOUS | Status: AC
  Filled 2014-05-16: qty 0.6

## 2014-05-16 NOTE — Progress Notes (Signed)
Jacob Mueller presents today for injection per MD orders. Neulasta 6mg  administered SQ in left Abdomen. Administration without incident. Patient tolerated well.

## 2014-05-30 ENCOUNTER — Encounter (HOSPITAL_COMMUNITY): Payer: Self-pay | Admitting: Emergency Medicine

## 2014-05-30 ENCOUNTER — Emergency Department (HOSPITAL_COMMUNITY)
Admission: EM | Admit: 2014-05-30 | Discharge: 2014-05-30 | Disposition: A | Discharge status: Home / Self Care | Payer: MEDICARE | Attending: Emergency Medicine | Admitting: Emergency Medicine

## 2014-05-30 DIAGNOSIS — Z79899 Other long term (current) drug therapy: Secondary | ICD-10-CM | POA: Insufficient documentation

## 2014-05-30 DIAGNOSIS — Z87891 Personal history of nicotine dependence: Secondary | ICD-10-CM | POA: Insufficient documentation

## 2014-05-30 DIAGNOSIS — Z8673 Personal history of transient ischemic attack (TIA), and cerebral infarction without residual deficits: Secondary | ICD-10-CM | POA: Insufficient documentation

## 2014-05-30 DIAGNOSIS — I2510 Atherosclerotic heart disease of native coronary artery without angina pectoris: Secondary | ICD-10-CM | POA: Insufficient documentation

## 2014-05-30 DIAGNOSIS — K219 Gastro-esophageal reflux disease without esophagitis: Secondary | ICD-10-CM | POA: Insufficient documentation

## 2014-05-30 DIAGNOSIS — I10 Essential (primary) hypertension: Secondary | ICD-10-CM | POA: Insufficient documentation

## 2014-05-30 DIAGNOSIS — Z86718 Personal history of other venous thrombosis and embolism: Secondary | ICD-10-CM | POA: Insufficient documentation

## 2014-05-30 DIAGNOSIS — L089 Local infection of the skin and subcutaneous tissue, unspecified: Secondary | ICD-10-CM | POA: Insufficient documentation

## 2014-05-30 DIAGNOSIS — Z7982 Long term (current) use of aspirin: Secondary | ICD-10-CM | POA: Insufficient documentation

## 2014-05-30 DIAGNOSIS — M129 Arthropathy, unspecified: Secondary | ICD-10-CM | POA: Insufficient documentation

## 2014-05-30 DIAGNOSIS — Z8546 Personal history of malignant neoplasm of prostate: Secondary | ICD-10-CM | POA: Insufficient documentation

## 2014-05-30 LAB — COMPREHENSIVE METABOLIC PANEL
ALT: 19 U/L (ref 0–53)
AST: 16 U/L (ref 0–37)
Albumin: 2.9 g/dL — ABNORMAL LOW (ref 3.5–5.2)
Alkaline Phosphatase: 103 U/L (ref 39–117)
BUN: 17 mg/dL (ref 6–23)
CALCIUM: 9.1 mg/dL (ref 8.4–10.5)
CO2: 25 mEq/L (ref 19–32)
CREATININE: 0.68 mg/dL (ref 0.50–1.35)
Chloride: 98 mEq/L (ref 96–112)
GLUCOSE: 186 mg/dL — AB (ref 70–99)
Potassium: 3.9 mEq/L (ref 3.7–5.3)
Sodium: 137 mEq/L (ref 137–147)
TOTAL PROTEIN: 6.5 g/dL (ref 6.0–8.3)
Total Bilirubin: 0.3 mg/dL (ref 0.3–1.2)

## 2014-05-30 LAB — CBC WITH DIFFERENTIAL/PLATELET
Basophils Absolute: 0 10*3/uL (ref 0.0–0.1)
Basophils Relative: 0 % (ref 0–1)
EOS PCT: 0 % (ref 0–5)
Eosinophils Absolute: 0 10*3/uL (ref 0.0–0.7)
HEMATOCRIT: 28.4 % — AB (ref 39.0–52.0)
Hemoglobin: 9.5 g/dL — ABNORMAL LOW (ref 13.0–17.0)
LYMPHS ABS: 0.8 10*3/uL (ref 0.7–4.0)
Lymphocytes Relative: 4 % — ABNORMAL LOW (ref 12–46)
MCH: 31 pg (ref 26.0–34.0)
MCHC: 33.5 g/dL (ref 30.0–36.0)
MCV: 92.8 fL (ref 78.0–100.0)
MONO ABS: 0.4 10*3/uL (ref 0.1–1.0)
Monocytes Relative: 2 % — ABNORMAL LOW (ref 3–12)
Neutro Abs: 19.2 10*3/uL — ABNORMAL HIGH (ref 1.7–7.7)
Neutrophils Relative %: 94 % — ABNORMAL HIGH (ref 43–77)
PLATELETS: 237 10*3/uL (ref 150–400)
RBC: 3.06 MIL/uL — AB (ref 4.22–5.81)
RDW: 16.4 % — ABNORMAL HIGH (ref 11.5–15.5)
WBC: 20.4 10*3/uL — ABNORMAL HIGH (ref 4.0–10.5)

## 2014-05-30 MED ORDER — VANCOMYCIN HCL IN DEXTROSE 1-5 GM/200ML-% IV SOLN
1000.0000 mg | Freq: Once | INTRAVENOUS | Status: AC
  Administered 2014-05-30: 1000 mg via INTRAVENOUS
  Filled 2014-05-30: qty 200

## 2014-05-30 MED ORDER — HEPARIN SOD (PORK) LOCK FLUSH 100 UNIT/ML IV SOLN
500.0000 [IU] | Freq: Once | INTRAVENOUS | Status: AC
  Administered 2014-05-30: 500 [IU]

## 2014-05-30 MED ORDER — HEPARIN SOD (PORK) LOCK FLUSH 100 UNIT/ML IV SOLN
INTRAVENOUS | Status: AC
  Filled 2014-05-30: qty 5

## 2014-05-30 MED ORDER — SULFAMETHOXAZOLE-TRIMETHOPRIM 800-160 MG PO TABS: 1.0000 | ORAL_TABLET | Freq: Two times a day (BID) | ORAL | Status: DC

## 2014-05-30 NOTE — Discharge Instructions (Signed)
Follow up with your md Monday.  Return this weekend if problems

## 2014-05-30 NOTE — ED Provider Notes (Signed)
CSN: [id number]     Arrival date & time 05/30/14  1439 History   First MD Initiated Contact with Patient 05/30/14 1507     Chief Complaint  Patient presents with  . Wound Infection     (Consider location/radiation/quality/duration/timing/severity/associated sxs/prior Treatment) Patient is a 73 y.o. male presenting with rash. The history is provided by a relative (pt has some swelling and discharge to right lower leg).  Rash Location: right lower leg. Severity:  Moderate Onset quality:  Sudden Timing:  Constant Progression:  Worsening Chronicity:  Recurrent Context: not animal contact   Associated symptoms: no abdominal pain, no diarrhea, no fatigue and no headaches     Past Medical History  Diagnosis Date  . Stroke   . Hypertension   . Coronary artery disease   . Arthritis   . Acid reflux disease   . DVT (deep venous thrombosis)     bilateral legs  . Cancer     Prostate- mets to bone   Past Surgical History  Procedure Laterality Date  . Knee arthroscopy Left   . Prostate biopsy  2014   Family History  Problem Relation Age of Onset  . Diabetes Mother    History  Substance Use Topics  . Smoking status: Former Research scientist (life sciences)  . Smokeless tobacco: Never Used  . Alcohol Use: No    Review of Systems  Constitutional: Negative for appetite change and fatigue.  HENT: Negative for congestion, ear discharge and sinus pressure.   Eyes: Negative for discharge.  Respiratory: Negative for cough.   Cardiovascular: Negative for chest pain.  Gastrointestinal: Negative for abdominal pain and diarrhea.  Genitourinary: Negative for frequency and hematuria.  Musculoskeletal: Negative for back pain.  Skin: Positive for rash.  Neurological: Negative for seizures and headaches.  Psychiatric/Behavioral: Negative for hallucinations.      Allergies  Claritin  Home Medications   Prior to Admission medications   Medication Sig Start Date End Date Taking? Authorizing Jenisa Monty   amLODipine (NORVASC) 10 MG tablet Take 20 mg by mouth daily.    Yes Historical Christopher Glasscock, MD  apixaban (ELIQUIS) 5 MG TABS tablet Take 1 tablet (5 mg total) by mouth 2 (two) times daily. 05/07/14  Yes Farrel Gobble, MD  aspirin EC 81 MG tablet Take 81 mg by mouth daily.   Yes Historical Dorr Perrot, MD  dexamethasone (DECADRON) 4 MG tablet Take 8 mg by mouth every morning.  03/06/14  Yes Farrel Gobble, MD  docusate sodium (COLACE) 100 MG capsule Take 100-200 mg by mouth every evening.    Yes Historical Jaekwon Mcclune, MD  Doxylamine Succinate, Sleep, (SLEEP AID PO) Take 1 tablet by mouth at bedtime as needed (for sleep).    Yes Historical Kelisha Dall, MD  furosemide (LASIX) 40 MG tablet Take 80 mg by mouth daily. 04/24/14  Yes Farrel Gobble, MD  leuprolide (LUPRON) 11.25 MG injection Inject 11.25 mg into the muscle every 6 (six) months.   Yes Historical Avyan Livesay, MD  lidocaine-prilocaine (EMLA) cream Apply a quarter size amount to port site 1 hour prior to chemo. Do not rub in. Cover with plastic wrap. 03/13/14  Yes Farrel Gobble, MD  lisinopril (PRINIVIL,ZESTRIL) 40 MG tablet Take 40 mg by mouth daily.   Yes Historical Sakari Raisanen, MD  metoCLOPramide (REGLAN) 5 MG tablet The day after chemo take 1 tablet four times a day x 48 hours. Then may take 1 tab four times a day if needed for nausea/vomiting. 03/13/14  Yes Farrel Gobble, MD  Multiple Minerals-Vitamins (PROSTEON PO) Take  2 tablets by mouth daily. Bone supplement   Yes Historical Katrice Goel, MD  Multiple Vitamins-Minerals (MULTIVITAMINS THER. W/MINERALS) TABS tablet Take 1 tablet by mouth daily.   Yes Historical Aundra Espin, MD  ondansetron (ZOFRAN-ODT) 4 MG disintegrating tablet Take 4 mg by mouth every 4 (four) hours as needed for nausea or vomiting.   Yes Historical Samhitha Rosen, MD  Oxycodone HCl 10 MG TABS Take 1-2 tablets every 4 hours as needed for pain 05/15/14  Yes Farrel Gobble, MD  prochlorperazine (COMPAZINE) 10 MG tablet The day after chemo take  1 tablet four times a day x 48 hours. Then may take 1 tab four times a day if needed for nausea/vomiting. 03/13/14  Yes Farrel Gobble, MD  ranitidine (ZANTAC) 150 MG tablet Take 150 mg by mouth 2 (two) times daily as needed for heartburn (may take up to 4 times daily as needed). Can take up to four times a day if needed   Yes Historical Terrian Sentell, MD  senna (SENOKOT) 8.6 MG tablet Take 2 tablets by mouth at bedtime.    Yes Historical Kolbee Stallman, MD  sertraline (ZOLOFT) 25 MG tablet Take 25 mg by mouth daily. 05/07/14  Yes Historical Marla Pouliot, MD  simvastatin (ZOCOR) 20 MG tablet Take 20 mg by mouth at bedtime.    Yes Historical Cledis Sohn, MD  enoxaparin (LOVENOX) 100 MG/ML injection Inject 1 mL (100 mg total) into the skin every 12 (twelve) hours. 05/15/14   Farrel Gobble, MD  sulfamethoxazole-trimethoprim (SEPTRA DS) 800-160 MG per tablet Take 1 tablet by mouth 2 (two) times daily. 05/30/14   Maudry Diego, MD   BP 123/76  Pulse 81  Temp(Src) 98.8 F (37.1 C) (Oral)  Resp 16  SpO2 98% Physical Exam  Constitutional: He is oriented to person, place, and time. He appears well-developed.  HENT:  Head: Normocephalic.  Eyes: Conjunctivae and EOM are normal. No scleral icterus.  Neck: Neck supple. No thyromegaly present.  Cardiovascular: Normal rate and regular rhythm.  Exam reveals no gallop and no friction rub.   No murmur heard. Pulmonary/Chest: No stridor. He has no wheezes. He has no rales. He exhibits no tenderness.  Abdominal: He exhibits no distension. There is no tenderness. There is no rebound.  Musculoskeletal: Normal range of motion. He exhibits edema.  Cellulitis and edema rll  Lymphadenopathy:    He has no cervical adenopathy.  Neurological: He is oriented to person, place, and time. He exhibits normal muscle tone. Coordination normal.  Skin: Rash noted. There is erythema.  Psychiatric: He has a normal mood and affect. His behavior is normal.    ED Course  Procedures  (including critical care time) Labs Review Labs Reviewed  CBC WITH DIFFERENTIAL - Abnormal; Notable for the following:    WBC 20.4 (*)    RBC 3.06 (*)    Hemoglobin 9.5 (*)    HCT 28.4 (*)    RDW 16.4 (*)    Neutrophils Relative % 94 (*)    Neutro Abs 19.2 (*)    Lymphocytes Relative 4 (*)    Monocytes Relative 2 (*)    All other components within normal limits  COMPREHENSIVE METABOLIC PANEL - Abnormal; Notable for the following:    Glucose, Bld 186 (*)    Albumin 2.9 (*)    All other components within normal limits  WOUND CULTURE    Imaging Review No results found.   EKG Interpretation None      MDM   Final diagnoses:  Skin infection  Maudry Diego, MD 05/30/14 [phone]

## 2014-05-30 NOTE — ED Notes (Signed)
Wounds cleaned with saline, patted dry, dressed with telfa, gauze, kerlix, and ace bandage.

## 2014-05-30 NOTE — ED Notes (Signed)
Daughter reports pt has had bilateral DVTs in February and since then has had problems with swelling in lower extremities.  Reports 2 days ago started getting open sores on r lower extremity.  Lower legs swollen, r worse than left.  Denies injury.

## 2014-05-30 NOTE — ED Notes (Signed)
Patient ask to be pulled up in the bed.  Patient was able to stand and move himself up in the bed.

## 2014-06-02 LAB — WOUND CULTURE

## 2014-06-03 ENCOUNTER — Telehealth (HOSPITAL_BASED_OUTPATIENT_CLINIC_OR_DEPARTMENT_OTHER): Payer: Self-pay | Admitting: Emergency Medicine

## 2014-06-03 NOTE — Telephone Encounter (Signed)
Post ED Visit - Positive Culture Follow-up  Culture report reviewed by antimicrobial stewardship pharmacist: [x]  Wes Oscoda, Pharm.D., BCPS []  Heide Guile, Pharm.D., BCPS []  Alycia Rossetti, Pharm.D., BCPS []  [place], Pharm.D., BCPS, AAHIVP []  Legrand Como, Pharm.D., BCPS, AAHIVP []  Juliene Pina, Pharm.D.  Positive Wound culture Treated with Bactrim, organism sensitive to the same and no further patient follow-up is required at this time.  Ernesta Amble 06/03/2014, 4:27 PM

## 2014-06-05 ENCOUNTER — Encounter (HOSPITAL_COMMUNITY): Payer: MEDICARE

## 2014-06-05 ENCOUNTER — Encounter (HOSPITAL_BASED_OUTPATIENT_CLINIC_OR_DEPARTMENT_OTHER): Payer: MEDICARE

## 2014-06-05 ENCOUNTER — Encounter (HOSPITAL_COMMUNITY): Payer: Self-pay

## 2014-06-05 VITALS — BP 119/52 | HR 66 | Temp 98.0°F | Resp 20 | Wt 211.9 lb

## 2014-06-05 DIAGNOSIS — Z86711 Personal history of pulmonary embolism: Secondary | ICD-10-CM

## 2014-06-05 DIAGNOSIS — C61 Malignant neoplasm of prostate: Secondary | ICD-10-CM

## 2014-06-05 DIAGNOSIS — D696 Thrombocytopenia, unspecified: Secondary | ICD-10-CM

## 2014-06-05 DIAGNOSIS — L02419 Cutaneous abscess of limb, unspecified: Secondary | ICD-10-CM

## 2014-06-05 DIAGNOSIS — C7952 Secondary malignant neoplasm of bone marrow: Secondary | ICD-10-CM

## 2014-06-05 DIAGNOSIS — C7951 Secondary malignant neoplasm of bone: Secondary | ICD-10-CM

## 2014-06-05 DIAGNOSIS — R590 Localized enlarged lymph nodes: Secondary | ICD-10-CM

## 2014-06-05 DIAGNOSIS — I82411 Acute embolism and thrombosis of right femoral vein: Secondary | ICD-10-CM

## 2014-06-05 DIAGNOSIS — B9689 Other specified bacterial agents as the cause of diseases classified elsewhere: Secondary | ICD-10-CM

## 2014-06-05 DIAGNOSIS — D6959 Other secondary thrombocytopenia: Secondary | ICD-10-CM

## 2014-06-05 DIAGNOSIS — Z86718 Personal history of other venous thrombosis and embolism: Secondary | ICD-10-CM

## 2014-06-05 DIAGNOSIS — L03119 Cellulitis of unspecified part of limb: Secondary | ICD-10-CM

## 2014-06-05 DIAGNOSIS — L03115 Cellulitis of right lower limb: Secondary | ICD-10-CM | POA: Insufficient documentation

## 2014-06-05 DIAGNOSIS — E09628 Drug or chemical induced diabetes mellitus with other skin complications: Secondary | ICD-10-CM

## 2014-06-05 LAB — CBC WITH DIFFERENTIAL/PLATELET
BASOS PCT: 0 % (ref 0–1)
Basophils Absolute: 0 10*3/uL (ref 0.0–0.1)
Eosinophils Absolute: 0 10*3/uL (ref 0.0–0.7)
Eosinophils Relative: 0 % (ref 0–5)
HCT: 29.3 % — ABNORMAL LOW (ref 39.0–52.0)
HEMOGLOBIN: 9.8 g/dL — AB (ref 13.0–17.0)
Lymphocytes Relative: 7 % — ABNORMAL LOW (ref 12–46)
Lymphs Abs: 1.1 10*3/uL (ref 0.7–4.0)
MCH: 31 pg (ref 26.0–34.0)
MCHC: 33.4 g/dL (ref 30.0–36.0)
MCV: 92.7 fL (ref 78.0–100.0)
MONO ABS: 0.7 10*3/uL (ref 0.1–1.0)
MONOS PCT: 4 % (ref 3–12)
NEUTROS ABS: 13.3 10*3/uL — AB (ref 1.7–7.7)
Neutrophils Relative %: 89 % — ABNORMAL HIGH (ref 43–77)
Platelets: 242 10*3/uL (ref 150–400)
RBC: 3.16 MIL/uL — ABNORMAL LOW (ref 4.22–5.81)
RDW: 16.2 % — ABNORMAL HIGH (ref 11.5–15.5)
WBC: 15.1 10*3/uL — ABNORMAL HIGH (ref 4.0–10.5)

## 2014-06-05 LAB — COMPREHENSIVE METABOLIC PANEL
ALBUMIN: 3 g/dL — AB (ref 3.5–5.2)
ALT: 16 U/L (ref 0–53)
AST: 17 U/L (ref 0–37)
Alkaline Phosphatase: 94 U/L (ref 39–117)
BILIRUBIN TOTAL: 0.2 mg/dL — AB (ref 0.3–1.2)
BUN: 19 mg/dL (ref 6–23)
CHLORIDE: 97 meq/L (ref 96–112)
CO2: 26 meq/L (ref 19–32)
CREATININE: 0.86 mg/dL (ref 0.50–1.35)
Calcium: 9.5 mg/dL (ref 8.4–10.5)
GFR calc Af Amer: >90 mL/min (ref >90)
GFR, EST NON AFRICAN AMERICAN: 86 mL/min — AB (ref >90)
GLUCOSE: 193 mg/dL — AB (ref 70–99)
Potassium: 4.3 mEq/L (ref 3.7–5.3)
Sodium: 136 mEq/L — ABNORMAL LOW (ref 137–147)
Total Protein: 6.9 g/dL (ref 6.0–8.3)

## 2014-06-05 LAB — HEMOGLOBIN A1C
Hgb A1c MFr Bld: 7.3 % — ABNORMAL HIGH (ref <5.7)
Mean Plasma Glucose: 163 mg/dL — ABNORMAL HIGH (ref <117)

## 2014-06-05 NOTE — Progress Notes (Signed)
LABS DRAWN FOR CBCD, CMP, PSA, HA1C.

## 2014-06-05 NOTE — Patient Instructions (Signed)
[facility] Discharge Instructions  RECOMMENDATIONS MADE BY THE CONSULTANT AND ANY TEST RESULTS WILL BE SENT TO YOUR REFERRING PHYSICIAN.  We will not treat you today.   Have your CT scans as ordered on July 9th and we will see you in July 13th to see the doctor and review results.   Thank you for choosing [facility] to provide your oncology and hematology care.  To afford each patient quality time with our providers, please arrive at least 15 minutes before your scheduled appointment time.  With your help, our goal is to use those 15 minutes to complete the necessary work-up to ensure our physicians have the information they need to help with your evaluation and healthcare recommendations.    Effective January 1st, 2014, we ask that you re-schedule your appointment with our physicians should you arrive 10 or more minutes late for your appointment.  We strive to give you quality time with our providers, and arriving late affects you and other patients whose appointments are after yours.    Again, thank you for choosing [facility].  Our hope is that these requests will decrease the amount of time that you wait before being seen by our physicians.       _____________________________________________________________  Should you have questions after your visit to [facility], please contact our office at [phone] between the hours of 8:30 a.m. and 4:30 p.m.  Voicemails left after 4:30 p.m. will not be returned until the following business day.  For prescription refill requests, have your pharmacy contact our office with your prescription refill request.    _______________________________________________________________  We hope that we have given you very good care.  You may receive a patient satisfaction survey in the mail, please complete it and return it as soon as possible.  We value your feedback!

## 2014-06-05 NOTE — Progress Notes (Signed)
[facility]  OFFICE PROGRESS NOTE  No primary Jeorgia Mueller on file. No primary Jacob Mueller on file.  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 Right lower extremity deep venous thrombosis on Eliquis which was switched  to Lovenox twice a day on 05/15/2014 when he developed a pulmonary embolism while on Eliquis. He was told to continue eliquis if Lovenox was not affordable.  INTERVAL HISTORY: Jacob Mueller 73 y.o. male returns for followup and continuation of chemotherapy for stage IV prostate cancer with intra-abdominal lymphadenopathy and bone metastases. He was seen recently in the emergency room with cellulitis of the right lower extremity with culture yielding Serratia marcescens sensitive to Bactrim for which she was given a prescription. There is a known deep venous thrombus in that extremity. He has enrolled at the wound clinic in [place], [place]. Patient is experiencing drenching night sweats. He denies any breast pain. He denies any chest pain, PND, orthopnea, or palpitations. Appetite is good with no nausea, vomiting, melena, hematochezia, or hematuria. There was some difficulty in obtaining Lovenox but the product is now in the pharmacy and he will  begin treatment with that agent tonight. He has been continuing on Eliquis up to today.  MEDICAL HISTORY: Past Medical History  Diagnosis Date  . Stroke   . Hypertension   . Coronary artery disease   . Arthritis   . Acid reflux disease   . DVT (deep venous thrombosis)     bilateral legs  . 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   Prostate cancer was made by biopsy on 01/08/2013, Gleason's 4+3 equals 7 with PSA of 322 local disease staged as T1 C. with estimated weight of 100 g. Last treatment utilizing depot Lupron, six-month variety, in January of 2015. For cycle #4 of docetaxel on 06/06/2014 given every 3 weeks with treatment begun on 04/03/2014. He received  radiotherapy to the lumbar spine epidural disease in late March 2015 and Casodex was discontinued on 03/12/2014 as a therapeutic maneuver.  ALLERGIES:  is allergic to claritin.  MEDICATIONS: has a current medication list which includes the following prescription(s): amlodipine, apixaban, aspirin ec, dexamethasone, docusate sodium, doxylamine succinate (sleep), enoxaparin, furosemide, leuprolide, lidocaine-prilocaine, lisinopril, metoclopramide, multiple minerals-vitamins, multivitamins ther. w/minerals, ondansetron, oxycodone hcl, prochlorperazine, ranitidine, senna, sertraline, simvastatin, and sulfamethoxazole-trimethoprim.  SURGICAL HISTORY:  Past Surgical History  Procedure Laterality Date  . Knee arthroscopy Left   . Prostate biopsy  2014    FAMILY HISTORY: family history includes Diabetes in his mother.  SOCIAL HISTORY:  reports that he has quit smoking. He has never used smokeless tobacco. He reports that he does not drink alcohol  or use illicit drugs.  REVIEW OF SYSTEMS:  Other than that discussed above is  noncontributory.  PHYSICAL EXAMINATION: ECOG PERFORMANCE STATUS: 2 - Symptomatic, <50% confined to bed  Blood pressure 119/52, pulse 66, temperature 98 F (36.7 C), temperature source Oral, resp. rate 20, weight 211 lb 14.4 oz (96.117 kg), SpO2 100.00%.  GENERAL:alert, no distress and comfortable SKIN: skin color, texture, turgor are normal, no rashes or significant lesions EYES: PERLA; Conjunctiva are pink and non-injected, sclera clear SINUSES: No redness or tenderness over maxillary or ethmoid sinuses OROPHARYNX:no exudate, no erythema on lips, buccal mucosa, or tongue. NECK: supple, thyroid normal size, non-tender, without nodularity. No masses CHEST: Increased AP diameter with bilateral gynecomastia. LYMPH:  no palpable lymphadenopathy in the cervical, axillary or inguinal LUNGS: clear to auscultation and percussion with normal breathing effort HEART: regular rate & rhythm and no murmurs. ABDOMEN:abdomen soft, non-tender and normal bowel sounds MUSCULOSKELETAL:no cyanosis of digits and no clubbing. Range of motion normal. Right lower 70 wound involving the mid lower extremity with greenish discharge but no hemorrhage. NEURO: alert & oriented x 3 with fluent speech, no focal motor/sensory deficits   LABORATORY DATA: Office Visit on 06/05/2014  Component Date Value Ref Range Status  . WBC 06/05/2014 15.1* 4.0 - 10.5 K/uL Final  . RBC 06/05/2014 3.16* 4.22 - 5.81 MIL/uL Final  . Hemoglobin 06/05/2014 9.8* 13.0 - 17.0 g/dL Final  . HCT 06/05/2014 29.3* 39.0 - 52.0 % Final  . MCV 06/05/2014 92.7  78.0 - 100.0 fL Final  . MCH 06/05/2014 31.0  26.0 - 34.0 pg Final  . MCHC 06/05/2014 33.4  30.0 - 36.0 g/dL Final  . RDW 06/05/2014 16.2* 11.5 - 15.5 % Final  . Platelets 06/05/2014 242  150 - 400 K/uL Final  . Neutrophils Relative % 06/05/2014 89* 43 - 77 % Final  . Neutro Abs 06/05/2014 13.3* 1.7 - 7.7 K/uL Final  . Lymphocytes Relative 06/05/2014 7* 12 - 46 % Final  . Lymphs Abs  06/05/2014 1.1  0.7 - 4.0 K/uL Final  . Monocytes Relative 06/05/2014 4  3 - 12 % Final  . Monocytes Absolute 06/05/2014 0.7  0.1 - 1.0 K/uL Final  . Eosinophils Relative 06/05/2014 0  0 - 5 % Final  . Eosinophils Absolute 06/05/2014 0.0  0.0 - 0.7 K/uL Final  . Basophils Relative 06/05/2014 0  0 - 1 % Final  . Basophils Absolute 06/05/2014 0.0  0.0 - 0.1 K/uL Final  . Sodium 06/05/2014 136* 137 - 147 mEq/L Final  . Potassium 06/05/2014 4.3  3.7 - 5.3 mEq/L Final  . Chloride 06/05/2014 97  96 - 112 mEq/L Final  . CO2 06/05/2014 26  19 - 32 mEq/L Final  . Glucose, Bld 06/05/2014 193* 70 - 99 mg/dL Final  . BUN 06/05/2014 19  6 - 23 mg/dL Final  . Creatinine, Ser 06/05/2014 0.86  0.50 - 1.35 mg/dL Final  . Calcium 06/05/2014 9.5  8.4 - 10.5 mg/dL Final  . Total Protein 06/05/2014 6.9  6.0 - 8.3 g/dL Final  . Albumin 06/05/2014 3.0* 3.5 - 5.2 g/dL Final  . AST 06/05/2014 17  0 - 37 U/L Final  . ALT 06/05/2014 16  0 - 53 U/L Final  . Alkaline Phosphatase 06/05/2014 94  39 - 117 U/L Final  . Total Bilirubin 06/05/2014 0.2* 0.3 - 1.2 mg/dL Final  . GFR calc non Af Amer 06/05/2014 86* >90 mL/min Final  . GFR calc Af Amer 06/05/2014 >90  >90  mL/min Final   Comment: (NOTE)                          The eGFR has been calculated using the CKD EPI equation.                          This calculation has not been validated in all clinical situations.                          eGFR's persistently <90 mL/min signify possible Chronic Kidney                          Disease.  Admission on 05/30/2014, Discharged on 05/30/2014  Component Date Value Ref Range Status  . WBC 05/30/2014 20.4* 4.0 - 10.5 K/uL Final  . RBC 05/30/2014 3.06* 4.22 - 5.81 MIL/uL Final  . Hemoglobin 05/30/2014 9.5* 13.0 - 17.0 g/dL Final  . HCT 05/30/2014 28.4* 39.0 - 52.0 % Final  . MCV 05/30/2014 92.8  78.0 - 100.0 fL Final  . MCH 05/30/2014 31.0  26.0 - 34.0 pg Final  . MCHC 05/30/2014 33.5  30.0 - 36.0 g/dL Final  . RDW  05/30/2014 16.4* 11.5 - 15.5 % Final  . Platelets 05/30/2014 237  150 - 400 K/uL Final  . Neutrophils Relative % 05/30/2014 94* 43 - 77 % Final  . Neutro Abs 05/30/2014 19.2* 1.7 - 7.7 K/uL Final  . Lymphocytes Relative 05/30/2014 4* 12 - 46 % Final  . Lymphs Abs 05/30/2014 0.8  0.7 - 4.0 K/uL Final  . Monocytes Relative 05/30/2014 2* 3 - 12 % Final  . Monocytes Absolute 05/30/2014 0.4  0.1 - 1.0 K/uL Final  . Eosinophils Relative 05/30/2014 0  0 - 5 % Final  . Eosinophils Absolute 05/30/2014 0.0  0.0 - 0.7 K/uL Final  . Basophils Relative 05/30/2014 0  0 - 1 % Final  . Basophils Absolute 05/30/2014 0.0  0.0 - 0.1 K/uL Final  . Sodium 05/30/2014 137  137 - 147 mEq/L Final  . Potassium 05/30/2014 3.9  3.7 - 5.3 mEq/L Final  . Chloride 05/30/2014 98  96 - 112 mEq/L Final  . CO2 05/30/2014 25  19 - 32 mEq/L Final  . Glucose, Bld 05/30/2014 186* 70 - 99 mg/dL Final  . BUN 05/30/2014 17  6 - 23 mg/dL Final  . Creatinine, Ser 05/30/2014 0.68  0.50 - 1.35 mg/dL Final  . Calcium 05/30/2014 9.1  8.4 - 10.5 mg/dL Final  . Total Protein 05/30/2014 6.5  6.0 - 8.3 g/dL Final  . Albumin 05/30/2014 2.9* 3.5 - 5.2 g/dL Final  . AST 05/30/2014 16  0 - 37 U/L Final  . ALT 05/30/2014 19  0 - 53 U/L Final  . Alkaline Phosphatase 05/30/2014 103  39 - 117 U/L Final  . Total Bilirubin 05/30/2014 0.3  0.3 - 1.2 mg/dL Final  . GFR calc non Af Amer 05/30/2014 >90  >90 mL/min Final  . GFR calc Af Amer 05/30/2014 >90  >90 mL/min Final   Comment: (NOTE)                          The eGFR has been calculated using the CKD EPI equation.  This calculation has not been validated in all clinical situations.                          eGFR's persistently <90 mL/min signify possible Chronic Kidney                          Disease.  Marland Kitchen Specimen Description 05/30/2014 LEG   Final  . Special Requests 05/30/2014 NONE   Final  . Gram Stain 05/30/2014    Final                   Value:RARE WBC PRESENT,  PREDOMINANTLY PMN                         RARE SQUAMOUS EPITHELIAL CELLS PRESENT                         RARE GRAM POSITIVE COCCI IN PAIRS                         Performed at [facility]  . Culture 05/30/2014    Final                   Value:ABUNDANT SERRATIA MARCESCENS                         Performed at [facility]  . Report Status 05/30/2014 06/02/2014 FINAL   Final  . Organism ID, Bacteria 05/30/2014 SERRATIA MARCESCENS   Final  Infusion on 05/15/2014  Component Date Value Ref Range Status  . WBC 05/15/2014 16.0* 4.0 - 10.5 K/uL Final  . RBC 05/15/2014 2.88* 4.22 - 5.81 MIL/uL Final  . Hemoglobin 05/15/2014 9.0* 13.0 - 17.0 g/dL Final  . HCT 05/15/2014 26.7* 39.0 - 52.0 % Final  . MCV 05/15/2014 92.7  78.0 - 100.0 fL Final  . MCH 05/15/2014 31.3  26.0 - 34.0 pg Final  . MCHC 05/15/2014 33.7  30.0 - 36.0 g/dL Final  . RDW 05/15/2014 17.3* 11.5 - 15.5 % Final  . Platelets 05/15/2014 173  150 - 400 K/uL Final  . Neutrophils Relative % 05/15/2014 91* 43 - 77 % Final  . Neutro Abs 05/15/2014 14.6* 1.7 - 7.7 K/uL Final  . Lymphocytes Relative 05/15/2014 4* 12 - 46 % Final  . Lymphs Abs 05/15/2014 0.6* 0.7 - 4.0 K/uL Final  . Monocytes Relative 05/15/2014 5  3 - 12 % Final  . Monocytes Absolute 05/15/2014 0.8  0.1 - 1.0 K/uL Final  . Eosinophils Relative 05/15/2014 0  0 - 5 % Final  . Eosinophils Absolute 05/15/2014 0.0  0.0 - 0.7 K/uL Final  . Basophils Relative 05/15/2014 0  0 - 1 % Final  . Basophils Absolute 05/15/2014 0.0  0.0 - 0.1 K/uL Final  . Sodium 05/15/2014 137  137 - 147 mEq/L Final  . Potassium 05/15/2014 3.4* 3.7 - 5.3 mEq/L Final  . Chloride 05/15/2014 100  96 - 112 mEq/L Final  . CO2 05/15/2014 24  19 - 32 mEq/L Final  . Glucose, Bld 05/15/2014 188* 70 - 99 mg/dL Final  . BUN 05/15/2014 12  6 - 23 mg/dL Final  . Creatinine, Ser 05/15/2014 0.70  0.50 - 1.35 mg/dL Final  . Calcium 05/15/2014 8.7  8.4 - 10.5 mg/dL Final  . Total  Protein 05/15/2014  5.9* 6.0 - 8.3 g/dL Final  . Albumin 05/15/2014 2.7* 3.5 - 5.2 g/dL Final  . AST 05/15/2014 16  0 - 37 U/L Final  . ALT 05/15/2014 15  0 - 53 U/L Final  . Alkaline Phosphatase 05/15/2014 82  39 - 117 U/L Final  . Total Bilirubin 05/15/2014 0.9  0.3 - 1.2 mg/dL Final  . GFR calc non Af Amer 05/15/2014 >90  >90 mL/min Final  . GFR calc Af Amer 05/15/2014 >90  >90 mL/min Final   Comment: (NOTE)                          The eGFR has been calculated using the CKD EPI equation.                          This calculation has not been validated in all clinical situations.                          eGFR's persistently <90 mL/min signify possible Chronic Kidney                          Disease.  Marland Kitchen PSA 05/15/2014 12.15* <=4.00 ng/mL Final   Comment: (NOTE)                          Test Methodology: ECLIA PSA (Electrochemiluminescence Immunoassay)                          For PSA values from 2.5-4.0, particularly in younger men <60 years                          old, the AUA and NCCN suggest testing for % Free PSA (3515) and                          evaluation of the rate of increase in PSA (PSA velocity).                          Performed at [facility]: Adenocarcinoma Gleason's 4+3 equals 7.  Urinalysis    Component Value Date/Time   COLORURINE YELLOW 04/24/2014 0910   APPEARANCEUR CLEAR 04/24/2014 0910   LABSPEC 1.015 04/24/2014 0910   PHURINE 6.0 04/24/2014 0910   GLUCOSEU NEGATIVE 04/24/2014 0910   HGBUR NEGATIVE 04/24/2014 0910   BILIRUBINUR NEGATIVE 04/24/2014 0910   KETONESUR NEGATIVE 04/24/2014 0910   PROTEINUR NEGATIVE 04/24/2014 0910   UROBILINOGEN 0.2 04/24/2014 0910   NITRITE NEGATIVE 04/24/2014 0910   LEUKOCYTESUR NEGATIVE 04/24/2014 0910    RADIOGRAPHIC STUDIES: No results found.  ASSESSMENT:  #1. Right lower extremity cellulitis secondary to Serratia marcescens. #2. 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 #3. Hypertension, controlled.  #4. Gastroesophageal reflux disease, controlled.  #5. 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  #6. Deep venous thrombosis bilateral lower extremities while on Eliquis, now with pulmonary embolism      PLAN:  #1. Hold chemotherapy today. #2. Lovenox 100 mg subcutaneously every 12 hours. #3. Continue Bactrim orally. #4. Continue  followup in one clinic in [place], [place]. #5. CT abdomen and pelvis on 06/19/2014 with followup visit on 06/23/2014 with CBC, chem profile, d-dimer, and PSA at that time. #6. Discussion was held with his daughter. If response is not seen in intra-abdominal lymph nodes, prognosis is guarded and would suggest conservative management henceforth. Alternative chemotherapy would include Jevtana. Patient is due for 6 monthly depot Lupron next month but the three-month variety will be provided instead.   All questions were answered. The patient knows to call the clinic with any problems, questions or concerns. We can certainly see the patient much sooner if necessary.   I spent 40 minutes counseling the patient face to face. The total time spent in the appointment was 55 minutes.    Doroteo Bradford, MD 06/05/2014 9:50 AM  DISCLAIMER:  This note was dictated with voice recognition software.  Similar sounding words can inadvertently be transcribed inaccurately and may not be corrected upon review.

## 2014-06-06 ENCOUNTER — Ambulatory Visit (HOSPITAL_COMMUNITY): Payer: MEDICARE

## 2014-06-06 LAB — PSA: PSA: 9.76 ng/mL — AB (ref <=4.00)

## 2014-06-19 ENCOUNTER — Ambulatory Visit (HOSPITAL_COMMUNITY)
Admission: RE | Admit: 2014-06-19 | Discharge: 2014-06-19 | Disposition: A | Discharge status: Home / Self Care | Payer: MEDICARE | Source: Ambulatory Visit | Attending: Hematology and Oncology | Admitting: Hematology and Oncology

## 2014-06-19 ENCOUNTER — Other Ambulatory Visit (HOSPITAL_COMMUNITY): Payer: Self-pay | Admitting: Hematology and Oncology

## 2014-06-19 ENCOUNTER — Encounter (HOSPITAL_COMMUNITY): Payer: Self-pay

## 2014-06-19 DIAGNOSIS — C61 Malignant neoplasm of prostate: Secondary | ICD-10-CM | POA: Insufficient documentation

## 2014-06-19 DIAGNOSIS — C778 Secondary and unspecified malignant neoplasm of lymph nodes of multiple regions: Secondary | ICD-10-CM | POA: Insufficient documentation

## 2014-06-19 DIAGNOSIS — C7951 Secondary malignant neoplasm of bone: Secondary | ICD-10-CM | POA: Insufficient documentation

## 2014-06-19 DIAGNOSIS — C7952 Secondary malignant neoplasm of bone marrow: Secondary | ICD-10-CM

## 2014-06-19 DIAGNOSIS — R590 Localized enlarged lymph nodes: Secondary | ICD-10-CM

## 2014-06-19 DIAGNOSIS — D492 Neoplasm of unspecified behavior of bone, soft tissue, and skin: Secondary | ICD-10-CM | POA: Insufficient documentation

## 2014-06-19 DIAGNOSIS — M8448XA Pathological fracture, other site, initial encounter for fracture: Secondary | ICD-10-CM | POA: Insufficient documentation

## 2014-06-19 MED ORDER — IOHEXOL 300 MG/ML  SOLN
100.0000 mL | Freq: Once | INTRAMUSCULAR | Status: AC | PRN
  Administered 2014-06-19: 100 mL via INTRAVENOUS

## 2014-06-19 NOTE — Progress Notes (Signed)
No primary provider on file. No primary provider on file.  Prostate cancer  URI (upper respiratory infection)  Nausea alone  Bone metastases  CURRENT THERAPY: S/P 3 cycles of Docetaxel finishing on 05/15/2014 and Depo-Lupron  INTERVAL HISTORY: Jacob Mueller 73 y.o. male returns for  regular  visit for followup of Stage IV prostate Cancer.    Prostate cancer   01/08/2013 Initial Diagnosis  Prostate cancer diagnosed by prostate ultrasound and biopsy, Gleason's 4+3 equal 7, T1 C., 100 g prostate. PSA 322   01/21/2013 Imaging Right lower extremity DVT with intra-abdominal lymphadenopathy.   02/23/2014 Cancer Staging Development of right lower extremity deep venous thrombosis with documentation of extensive lymph node involvement in the abdomen and pelvis on CT scan.   03/12/2014 Treatment Plan Change Casodex discontinued. Plan Taxotere intravenously every 3 weeks. Continue Depo Lupron every 3 months.   04/03/2014 - 05/15/2014 Chemotherapy Docetaxel with Neulasta support x 3 cycles   06/19/2014 Imaging CT Abd/pelvis- Extensive metastatic disease to pelvic, retroperitoneal and retrocrural lymph nodes as described. Retrocrural and pelvic disease has worsened. Increased epidural tumor on the right at T10-11.     Bone metastases   03/11/2014 - 03/25/2014 Radiation Therapy     I personally reviewed and went over laboratory results with the patient.  The results are noted within this dictation.  I personally reviewed and went over radiographic studies with the patient.  The results are noted within this dictation.  CT abd/pelvis on 7/9 demonstrates the following: 1. Extensive metastatic disease to pelvic, retroperitoneal and  retrocrural lymph nodes as described. Disease within the mid  retroperitoneum has improved, suggesting interval radiation therapy.  However, the retrocrural and pelvic disease has worsened.  2. Grossly stable widespread osseous metastatic disease. Mild  progression and L1 and  L2 fractures. Increased epidural tumor on the  right at T10-11.  3. Persistent probable partial obstruction of the right ureter.  4. Possible persistent extrinsic mass effect on the right iliac  vein.  5. New left lower lobe atelectasis.  With progression of disease, we have two treatment options:  A. Jevtana day 1 with neulasta support on day 2 every 21 days  B. Hospice/Comfort Care.  We had a long conversation regarding Hospice vs Chemotherapy.  The patient is aware that chemotherapy may adversely affect quality of life and may not prove to be beneficial.  Patient education was given regarding Hospice and the services they provide.  Hospice will allow the patient to stay at home at end of life or go to a facility for end of life care.  At this point, the patient would like to remain at home.  Hospice provides the patient with a team of providers to help with care including physicians, nurses, aids, chaplains, and social workers.  Hospice's goal is to keep patients out of the hospital and and comfortable by controlling symptoms with medications.  The patient is certainly Hospice appropriate with a life expectancy of less than 6 months.  The patient has declined active therapy at this time.  The patient understands that he can be discharged from Hospice at anytime.    He has a big decision to make regarding his future treatment plan.  He is interested in mulling over this decision over the weekend.  He will contact us Monday with his decision.  Oncologically, he continues to have pain.  He also notes a cough that is productive of thick yellow sputum.  I will treat with Augmentin.  He also  notes some nausea, and we will try Zofran.  He will continue with his pain regimen for the time being. Otherwise, he denies any complaints.   Past Medical History  Diagnosis Date  . Stroke   . Hypertension   . Coronary artery disease   . Arthritis   . Acid reflux disease   . DVT (deep venous thrombosis)      bilateral legs  . 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     is allergic to claritin.  Jacob Mueller had no medications administered during this visit.  Past Surgical History  Procedure Laterality Date  . Knee arthroscopy Left   . Prostate biopsy  2014    Denies any headaches, dizziness, double vision, fevers, chills, night sweats, nausea, vomiting, diarrhea, constipation, chest pain, heart palpitations, shortness of breath, blood in stool, black tarry stool, urinary pain, urinary burning, urinary frequency, hematuria.   PHYSICAL EXAMINATION  ECOG PERFORMANCE STATUS: 3 - Symptomatic, >50% confined to bed  Filed Vitals:   06/20/14 1034  BP: 104/47  Pulse: 72  Temp: 98.8 F (37.1 C)  Resp: 18    GENERAL:alert, well nourished, well developed, comfortable and cooperative SKIN: skin color, texture, turgor are normal, no rashes or significant lesions HEAD: Normocephalic, No masses, lesions, tenderness or abnormalities EYES: normal, PERRLA, EOMI, Conjunctiva are pink and non-injected EARS: External ears normal OROPHARYNX:mucous membranes are moist  NECK: supple, trachea midline LYMPH:  not examined BREAST:not examined LUNGS: not examined HEART: not examined ABDOMEN:not examined BACK: Back symmetric, no curvature. EXTREMITIES:right LE is cleanly wrapped with edema.  NEURO: alert & oriented x 3 with fluent speech, no focal motor/sensory deficits, in a wheelchair    LABORATORY DATA: CBC    Component Value Date/Time   WBC 15.1* 06/05/2014 0911   RBC 3.16* 06/05/2014 0911   HGB 9.8* 06/05/2014 0911   HCT 29.3* 06/05/2014 0911   PLT 242 06/05/2014 0911   MCV 92.7 06/05/2014 0911   MCH 31.0 06/05/2014 0911   MCHC 33.4 06/05/2014 0911   RDW 16.2* 06/05/2014 0911   LYMPHSABS 1.1  06/05/2014 0911   MONOABS 0.7 06/05/2014 0911   EOSABS 0.0 06/05/2014 0911   BASOSABS 0.0 06/05/2014 0911      Chemistry      Component Value Date/Time   NA 136* 06/05/2014 0911   K 4.3 06/05/2014 0911   CL 97 06/05/2014 0911   CO2 26 06/05/2014 0911   BUN 19 06/05/2014 0911   CREATININE 0.86 06/05/2014 0911      Component Value Date/Time   CALCIUM 9.5 06/05/2014 0911   ALKPHOS 94 06/05/2014 0911   AST 17 06/05/2014 0911   ALT 16 06/05/2014 0911   BILITOT 0.2* 06/05/2014 0911     Lab Results  Component Value Date   PSA 9.76* 06/05/2014   PSA 12.15* 05/15/2014   PSA 11.29* 04/24/2014     RADIOGRAPHIC STUDIES:  Ct Abdomen Pelvis W Contrast  06/19/2014   CLINICAL DATA:  Restaging metastatic prostate cancer post chemotherapy.  EXAM: CT ABDOMEN AND PELVIS WITH CONTRAST  TECHNIQUE: Multidetector CT imaging of the abdomen and pelvis was performed using the standard protocol following bolus administration of intravenous contrast.  CONTRAST:  186mL OMNIPAQUE IOHEXOL 300 MG/ML  SOLN  COMPARISON:  Lumbar MRI 03/06/2014.  CTs 02/23/2014.  FINDINGS: There is  new atelectasis in the left lower lobe with associated air bronchograms. No endobronchial lesion or focal nodule is demonstrated. There is no pleural or pericardial effusion.  Multiple small hepatic cysts are stable. The spleen, gallbladder and biliary system appear normal. The pancreas is atrophied but stable. There is no adrenal mass. Bilateral renal cysts are stable. There is persistent mild right-sided hydronephrosis and delayed contrast excretion.  Extensive lymphadenopathy has mostly progressed. A right retrocrural node measures 3.5 x 3.7 cm on image 16 (previously 1.7 cm). A node adjacent to the right adrenal gland has enlarged, measuring 2.0 cm on image 26. Most of the retroperitoneal lymph nodes around the renal vessels and extending inferiorly to the aortic bifurcation have decreased in size suggesting radiation therapy to this region. However, there  is a persistent right common iliac node measuring 3.2 x 3.2 cm on image 62, not significantly changed. In addition, there is progressive adenopathy along the right pelvic sidewall, measuring up to 8.2 x 5.6 cm on image 72 (previously 6.6 x 3.5 cm). 1.7 cm right inguinal node on image 92 is stable. There are no significantly enlarged lymph nodes within the left pelvis. The right pelvic adenopathy likely causes persistent partial obstruction of the right ureter. In addition, there is asymmetric opacification of the right external iliac vein, and central venous obstruction cannot be excluded.  The stomach, small bowel and colon demonstrate no significant findings. There is no evidence of bowel obstruction or focal extraluminal fluid collection. A small supraumbilical hernia containing only fat is stable. Aortoiliac atherosclerosis appears stable. There is stable mild enlargement of the prostate gland. The bladder appears normal.  There is asymmetric subcutaneous edema within the right flank and right pelvis. Widespread blastic metastatic disease to the spine and pelvis is again noted. There is slightly progressive asymmetric Schmorl's node formation on the left involving the superior endplate of L1 and the inferior endplate of L2, potentially pathologic. No definite residual epidural tumor is evident in this area. However, there is increased epidural tumor surrounding the right T10 pedicle with extension into the lateral aspect of the right T10-11 foramen.  IMPRESSION: 1. Extensive metastatic disease to pelvic, retroperitoneal and retrocrural lymph nodes as described. Disease within the mid retroperitoneum has improved, suggesting interval radiation therapy. However, the retrocrural and pelvic disease has worsened. 2. Grossly stable widespread osseous metastatic disease. Mild progression and L1 and L2 fractures. Increased epidural tumor on the right at T10-11. 3. Persistent probable partial obstruction of the right  ureter. 4. Possible persistent extrinsic mass effect on the right iliac vein. 5. New left lower lobe atelectasis.   Electronically Signed   By: Camie Patience M.D.   On: 06/19/2014 15:25      ASSESSMENT:  1. Progressive Stage IV Prostate Cancer, platinum-resistant 2. Right LE cel;lulitis 3. Right LE DVT, on lovenox 4. PE, on Lovenox 5. B/L LE DVTs while on Eliquis, on Lovenox 6. Nausea 7. URI with cough and productive of sputum   Patient Active Problem List   Diagnosis Date Noted  . Cellulitis of leg, right 06/05/2014  . Prostate cancer 03/03/2014  . Bone metastases 03/03/2014  . Intra-abdominal lymphadenopathy 03/03/2014  . Hypertension 03/03/2014  . 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 I personally reviewed and went over laboratory results with the patient.  The results are noted within this dictation. 2. I personally reviewed and went over radiographic studies with the patient.  The results are noted  within this dictation.   3. Treatment options:  A. Jevtana  B. Hospice 4. He will consider his options as above.  He will contact us on Monday. 5. Rx for Dexamethasone 6. Rx for Zofran 7. Rx for Augmentin 8. Return in 1-2 weeks for follow-up   THERAPY PLAN:  His options are outlined above and he will consider them.  In the interim we will focus of palliation and symptom management.  All questions were answered. The patient knows to call the clinic with any problems, questions or concerns. We can certainly see the patient much sooner if necessary.  Patient and plan discussed with Dr. Farrel Gobble and he is in agreement with the aforementioned.   More than 50% of the time spent with the patient was utilized for counseling and coordination of care.  KEFALAS,THOMAS 06/20/2014

## 2014-06-20 ENCOUNTER — Ambulatory Visit (HOSPITAL_COMMUNITY): Payer: MEDICARE

## 2014-06-20 ENCOUNTER — Encounter (HOSPITAL_COMMUNITY): Payer: MEDICARE | Attending: Hematology and Oncology | Admitting: Oncology

## 2014-06-20 VITALS — BP 104/47 | HR 72 | Temp 98.8°F | Resp 18

## 2014-06-20 DIAGNOSIS — C61 Malignant neoplasm of prostate: Secondary | ICD-10-CM

## 2014-06-20 DIAGNOSIS — J069 Acute upper respiratory infection, unspecified: Secondary | ICD-10-CM

## 2014-06-20 DIAGNOSIS — R110 Nausea: Secondary | ICD-10-CM

## 2014-06-20 DIAGNOSIS — C7952 Secondary malignant neoplasm of bone marrow: Secondary | ICD-10-CM

## 2014-06-20 DIAGNOSIS — C7951 Secondary malignant neoplasm of bone: Secondary | ICD-10-CM

## 2014-06-20 MED ORDER — ONDANSETRON HCL 8 MG PO TABS: 8.0000 mg | ORAL_TABLET | Freq: Three times a day (TID) | ORAL | Status: AC | PRN

## 2014-06-20 MED ORDER — DEXAMETHASONE 4 MG PO TABS: 8.0000 mg | ORAL_TABLET | Freq: Every day | ORAL | Status: AC

## 2014-06-20 MED ORDER — AMOXICILLIN-POT CLAVULANATE 875-125 MG PO TABS: 1.0000 | ORAL_TABLET | Freq: Two times a day (BID) | ORAL | Status: AC

## 2014-06-20 NOTE — Patient Instructions (Addendum)
[facility] Discharge Instructions  RECOMMENDATIONS MADE BY THE CONSULTANT AND ANY TEST RESULTS WILL BE SENT TO YOUR REFERRING PHYSICIAN.  EXAM FINDINGS BY THE PHYSICIAN TODAY AND SIGNS OR SYMPTOMS TO REPORT TO CLINIC OR PRIMARY PHYSICIAN:  Dexamethasone reordered through your pharmacy.  Call [facility] with your decision on whether or not to do Jevtana chemo or Hospice.  Continue with pain medication.   Follow up with Tom in 1-2 weeks. July 24 @ 10 with Gershon Mussel.    Thank you for choosing [facility] to provide your oncology and hematology care.  To afford each patient quality time with our providers, please arrive at least 15 minutes before your scheduled appointment time.  With your help, our goal is to use those 15 minutes to complete the necessary work-up to ensure our physicians have the information they need to help with your evaluation and healthcare recommendations.    Effective January 1st, 2014, we ask that you re-schedule your appointment with our physicians should you arrive 10 or more minutes late for your appointment.  We strive to give you quality time with our providers, and arriving late affects you and other patients whose appointments are after yours.    Again, thank you for choosing [facility].  Our hope is that these requests will decrease the amount of time that you wait before being seen by our physicians.       _____________________________________________________________  Should you have questions after your visit to [facility], please contact our office at [phone] between the hours of 8:30 a.m. and 5:00 p.m.  Voicemails left after 4:30 p.m. will not be returned until the following business day.  For prescription refill requests, have your pharmacy contact our office with your prescription refill request.    Cabazitaxel injection What is this medicine? CABAZITAXEL (ka baz i TAX el) is a chemotherapy drug.  It is used to treat prostate cancer. It targets fast dividing cells, like cancer cells, and causes these cells to die. This medicine may be used for other purposes; ask your health care provider or pharmacist if you have questions. COMMON BRAND NAME(S): Jevtana What should I tell my health care provider before I take this medicine? They need to know if you have any of these conditions: -kidney disease -liver disease -low blood counts, like low white cell, platelet, or red cell counts -an unusual or allergic reaction to cabazitaxel, other medicines, foods, dyes, or preservatives -pregnant or trying to get pregnant -breast-feeding How should I use this medicine? This medicine is for infusion into a vein. It is given by a health care professional in a hospital or clinic setting. Talk to your pediatrician regarding the use of this medicine in children. Special care may be needed. Overdosage: If you think you've taken too much of this medicine contact a poison control center or emergency room at once. Overdosage: If you think you have taken too much of this medicine contact a poison control center or emergency room at once. NOTE: This medicine is only for you. Do not share this medicine with others. What if I miss a dose? It is important not to miss your dose. Call your doctor or health care professional if you are unable to keep an appointment. What may interact with this medicine? -atazanavir -clarithromycin -indinavir -medicines for fungal infections like ketoconazole, fluconazole, itraconazole, and voriconazole -nefazodone -nelfinavir -ritonavir -saquinavir -telithromycin This list may not describe all possible interactions. Give your health  care provider a list of all the medicines, herbs, non-prescription drugs, or dietary supplements you use. Also tell them if you smoke, drink alcohol, or use illegal drugs. Some items may interact with your medicine. What should I watch for while  using this medicine? Your condition will be monitored carefully while you are receiving this medicine. This drug may make you feel generally unwell. This is not uncommon, as chemotherapy can affect healthy cells as well as cancer cells. Report any side effects. Continue your course of treatment even though you feel ill unless your doctor tells you to stop. Call your doctor or health care professional for advice if you get a fever, chills or sore throat, or other symptoms of a cold or flu. Do not treat yourself. This drug decreases your body's ability to fight infections. Try to avoid being around people who are sick. This medicine may increase your risk to bruise or bleed. Call your doctor or health care professional if you notice any unusual bleeding. Be careful brushing and flossing your teeth or using a toothpick because you may get an infection or bleed more easily. If you have any dental work done, tell your dentist you are receiving this medicine. Avoid taking products that contain aspirin, acetaminophen, ibuprofen, naproxen, or ketoprofen unless instructed by your doctor. These medicines may hide a fever. Do not become pregnant while taking this medicine. Women should inform their doctor if they wish to become pregnant or think they might be pregnant. There is a potential for serious side effects to an unborn child. Talk to your health care professional or pharmacist for more information. Do not breast-feed an infant while taking this medicine. What side effects may I notice from receiving this medicine? Side effects that you should report to your doctor or health care professional as soon as possible: -allergic reactions like skin rash, itching or hives, swelling of the face, lips, or tongue -breathing problems -constipation -diarrhea -low blood counts - this medicine may decrease the number of white blood cells, red blood cells and platelets. You may be at increased risk for infections and  bleeding. -pain, tingling, numbness in the hands or feet -severe abdominal pain -signs of infection - fever or chills, cough, sore throat, pain or difficulty passing urine -signs of decreased platelets or bleeding - bruising, pinpoint red spots on the skin, black, tarry stools, blood in the urine -signs of decreased red blood cells - unusually weak or tired, fainting spells, lightheadedness -vomiting Side effects that usually do not require medical attention (Report these to your doctor or health care professional if they continue or are bothersome.): -back pain -change in taste -hair loss -headache -loss of appetite -muscle or joint pain -nausea -upset stomach This list may not describe all possible side effects. Call your doctor for medical advice about side effects. You may report side effects to FDA at 1-800-FDA-1088. Where should I keep my medicine? This drug is given in a hospital or clinic and will not be stored at home. NOTE: This sheet is a summary. It may not cover all possible information. If you have questions about this medicine, talk to your doctor, pharmacist, or health care provider.  2015, [employer]. (2013-11-12 10:50:16)

## 2014-06-23 ENCOUNTER — Telehealth (HOSPITAL_COMMUNITY): Payer: Self-pay | Admitting: *Deleted

## 2014-06-23 ENCOUNTER — Ambulatory Visit (HOSPITAL_COMMUNITY): Payer: MEDICARE

## 2014-06-23 NOTE — Telephone Encounter (Signed)
Patient wants to be referred to Hospice. I faxed the order to [facility] this morning and I called to confirm that they received the fax. Nicole Kindred stated that he did receive it and he sent it to the Hospice division of [facility].

## 2014-06-24 ENCOUNTER — Other Ambulatory Visit (HOSPITAL_COMMUNITY): Payer: Self-pay | Admitting: Oncology

## 2014-06-24 DIAGNOSIS — C7951 Secondary malignant neoplasm of bone: Secondary | ICD-10-CM

## 2014-06-24 DIAGNOSIS — C61 Malignant neoplasm of prostate: Secondary | ICD-10-CM

## 2014-06-24 MED ORDER — FENTANYL 25 MCG/HR TD PT72: 25.0000 ug | MEDICATED_PATCH | TRANSDERMAL | Status: DC

## 2014-06-24 MED ORDER — MORPHINE SULFATE (CONCENTRATE) 20 MG/ML PO SOLN: 2.0000 mg | ORAL | Status: AC | PRN

## 2014-07-02 ENCOUNTER — Other Ambulatory Visit (HOSPITAL_COMMUNITY): Payer: Self-pay | Admitting: Oncology

## 2014-07-02 DIAGNOSIS — C61 Malignant neoplasm of prostate: Secondary | ICD-10-CM

## 2014-07-02 DIAGNOSIS — C7951 Secondary malignant neoplasm of bone: Secondary | ICD-10-CM

## 2014-07-02 MED ORDER — FENTANYL 50 MCG/HR TD PT72: 50.0000 ug | MEDICATED_PATCH | TRANSDERMAL | Status: DC

## 2014-07-04 ENCOUNTER — Ambulatory Visit (HOSPITAL_COMMUNITY): Payer: MEDICARE | Admitting: Oncology

## 2014-07-14 ENCOUNTER — Other Ambulatory Visit (HOSPITAL_COMMUNITY): Payer: Self-pay | Admitting: Oncology

## 2014-07-14 DIAGNOSIS — C7951 Secondary malignant neoplasm of bone: Secondary | ICD-10-CM

## 2014-07-14 DIAGNOSIS — C61 Malignant neoplasm of prostate: Secondary | ICD-10-CM

## 2014-07-14 MED ORDER — FENTANYL 50 MCG/HR TD PT72: 50.0000 ug | MEDICATED_PATCH | TRANSDERMAL | Status: DC

## 2014-08-06 ENCOUNTER — Other Ambulatory Visit (HOSPITAL_COMMUNITY): Payer: Self-pay | Admitting: Oncology

## 2014-08-06 DIAGNOSIS — C61 Malignant neoplasm of prostate: Secondary | ICD-10-CM

## 2014-08-06 DIAGNOSIS — C7951 Secondary malignant neoplasm of bone: Secondary | ICD-10-CM

## 2014-08-06 MED ORDER — FENTANYL 50 MCG/HR TD PT72: 50.0000 ug | MEDICATED_PATCH | TRANSDERMAL | Status: AC

## 2014-09-11 DEATH — deceased

## 2015-04-11 IMAGING — CT CT ABD-PELV W/ CM
2 of 5 series · 14 of 46 positions shown, 16 images · IV contrast (Omnipaque 300)
Comparison: Lumbar MRI 03/06/2014.  CTs 02/23/2014.

CLINICAL DATA: Restaging metastatic prostate cancer post
chemotherapy.

EXAM:
CT ABDOMEN AND PELVIS WITH CONTRAST
TECHNIQUE: Multidetector CT imaging of the abdomen and pelvis was performed
using the standard protocol following bolus administration of
intravenous contrast.
CONTRAST:  100mL OMNIPAQUE IOHEXOL 300 MG/ML  SOLN

[Series 3: abd_pel_with 5.0 b40f · axial · 0.76mm/px · z∈[-618,-168]mm · 11 of 102 slices shown, 13 images]
[im 6/102  soft-tissue]
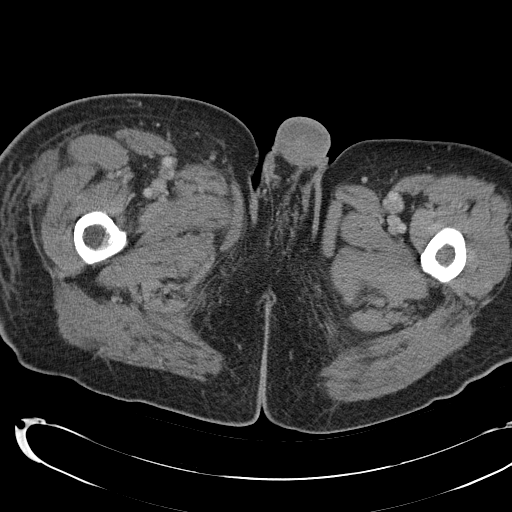
[im 6/102  bone]
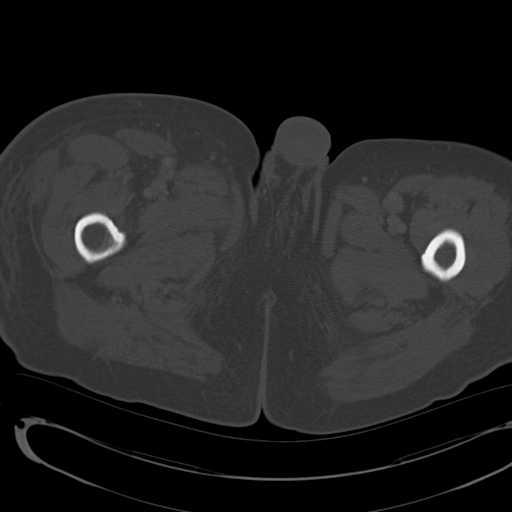
[im 16/102  soft-tissue]
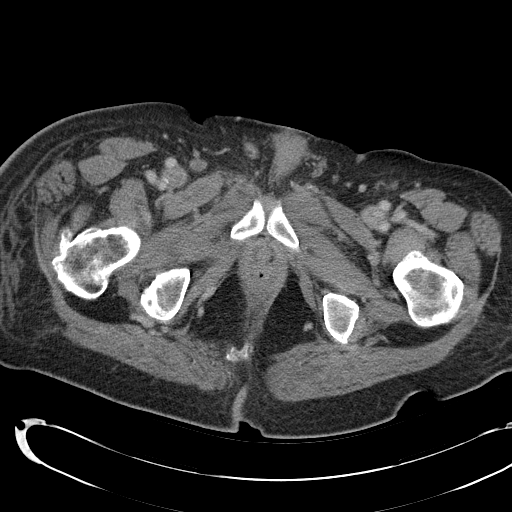
[im 27/102  soft-tissue]
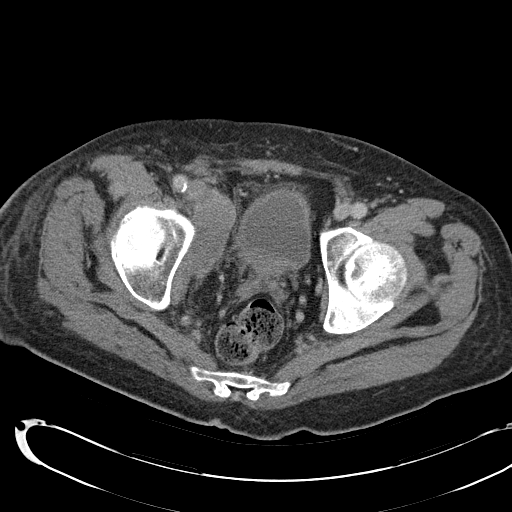
[im 32/102  soft-tissue]
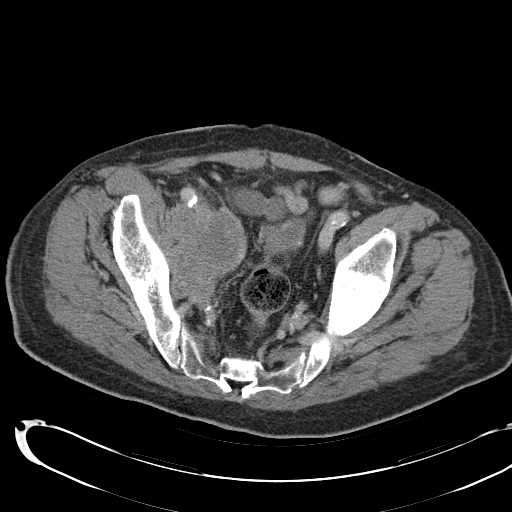
[im 43/102  soft-tissue]
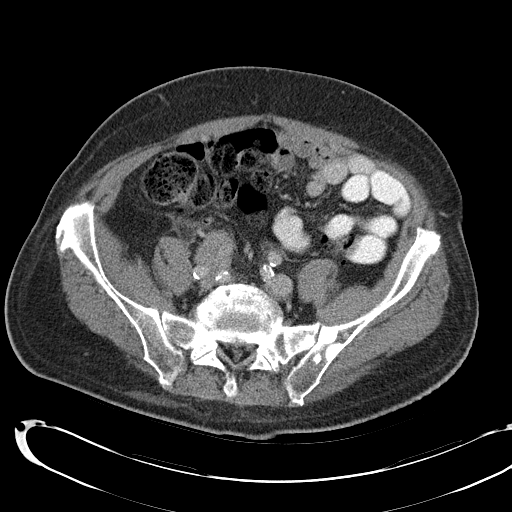
[im 54/102  soft-tissue]
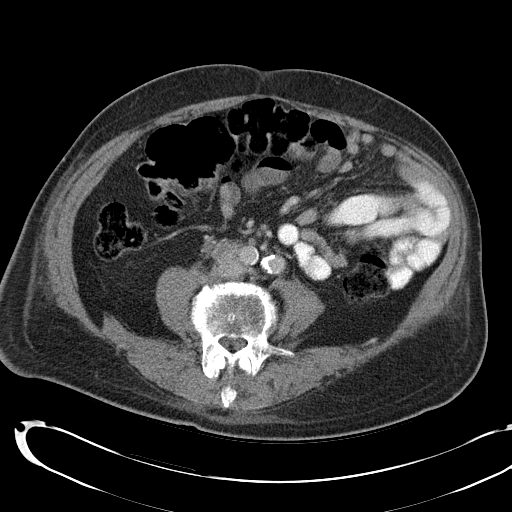
[im 59/102  soft-tissue]
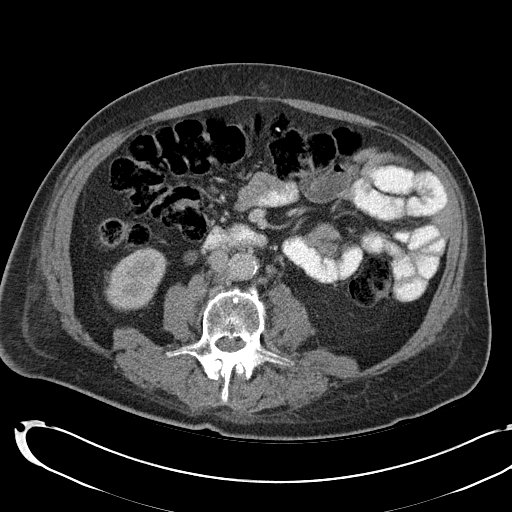
[im 70/102  soft-tissue]
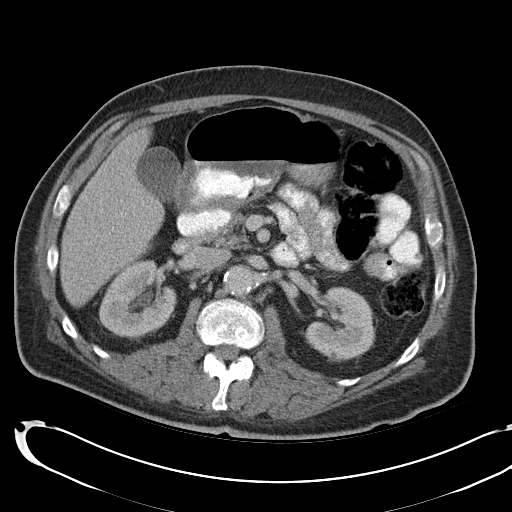
[im 75/102  soft-tissue]
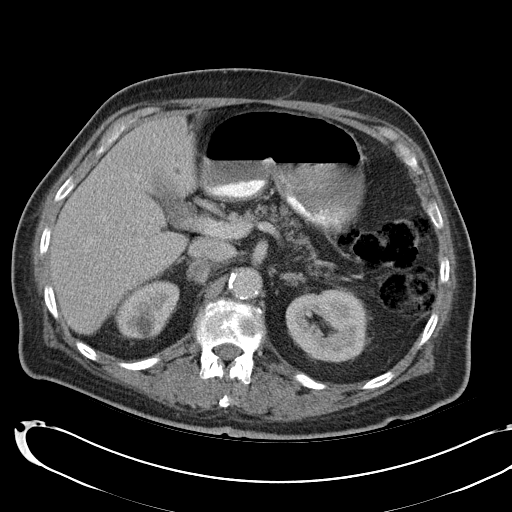
[im 75/102  bone]
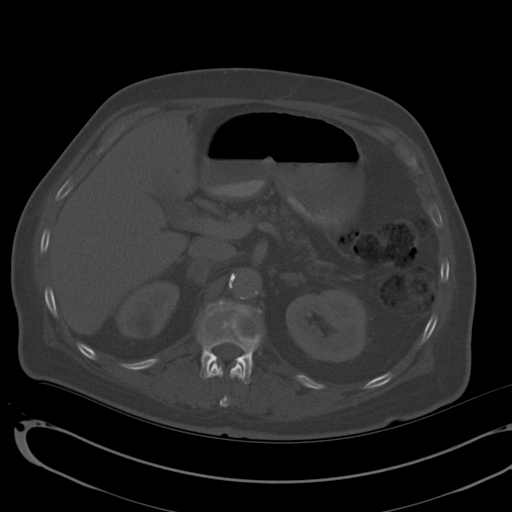
[im 86/102  soft-tissue]
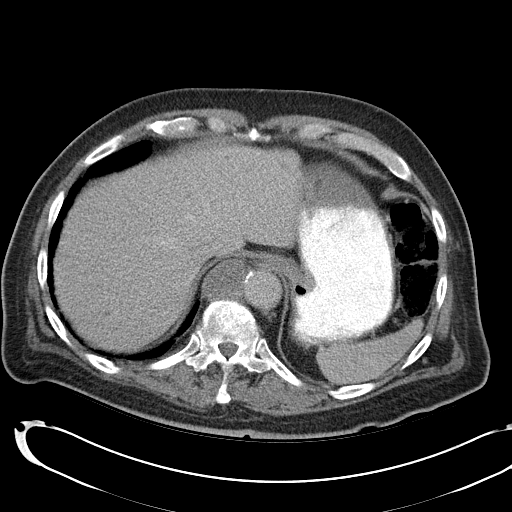
[im 96/102  soft-tissue]
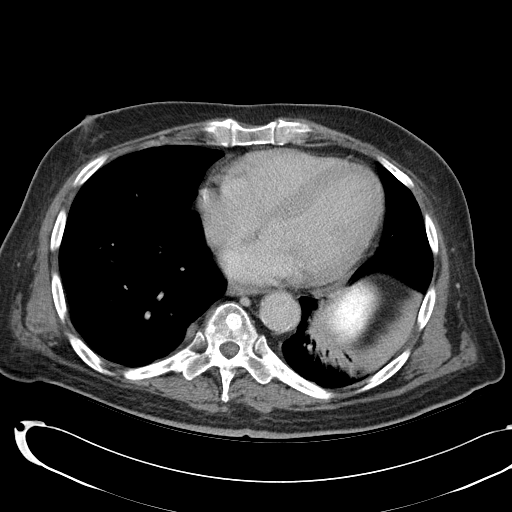

[Series 5: abd_pel_with 3.0 spo cor · coronal · 0.76mm/px · 3 of 94 slices shown]
[im 32/94  soft-tissue]
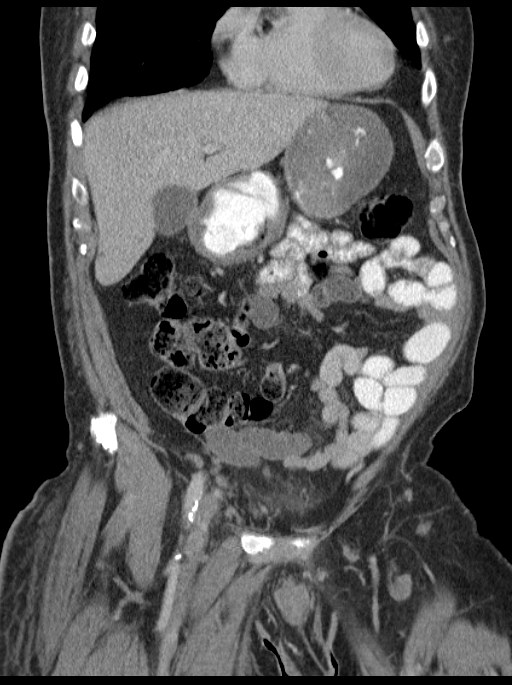
[im 42/94  soft-tissue]
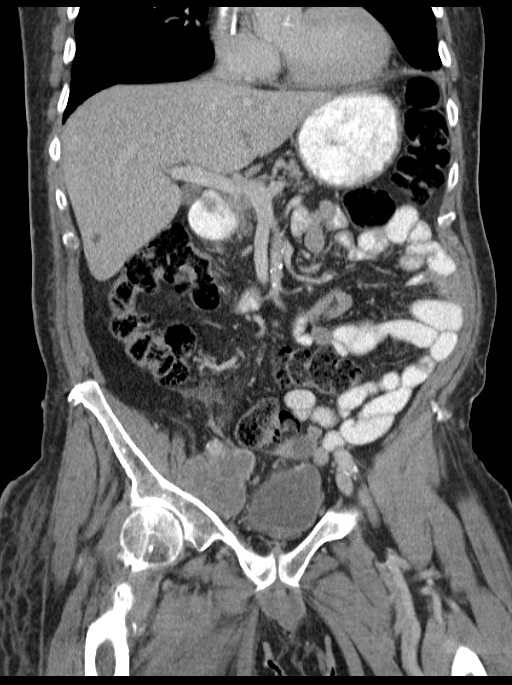
[im 52/94  soft-tissue]
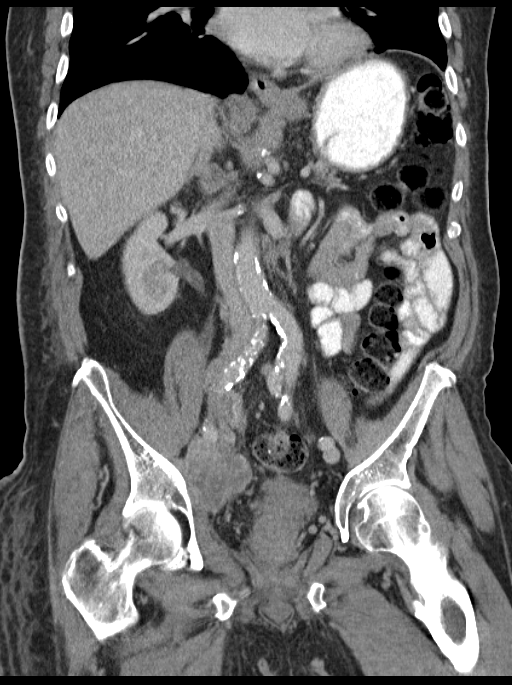

[14 of 46 positions shown; findings below may reference images not displayed]

FINDINGS: There is new atelectasis in the left lower lobe with associated air
bronchograms. No endobronchial lesion or focal nodule is
demonstrated. There is no pleural or pericardial effusion.

Multiple small hepatic cysts are stable. The spleen, gallbladder and
biliary system appear normal. The pancreas is atrophied but stable.
There is no adrenal mass. Bilateral renal cysts are stable. There is
persistent mild right-sided hydronephrosis and delayed contrast
excretion.

Extensive lymphadenopathy has mostly progressed. A right retrocrural
node measures 3.5 x 3.7 cm on image 16 (previously 1.7 cm). A node
adjacent to the right adrenal gland has enlarged, measuring 2.0 cm
on image 26. Most of the retroperitoneal lymph nodes around the
renal vessels and extending inferiorly to the aortic bifurcation
have decreased in size suggesting radiation therapy to this region.
However, there is a persistent right common iliac node measuring
x 3.2 cm on image 62, not significantly changed. In addition, there
is progressive adenopathy along the right pelvic sidewall, measuring
up to 8.2 x 5.6 cm on image 72 (previously 6.6 x 3.5 cm). 1.7 cm
right inguinal node on image 92 is stable. There are no
significantly enlarged lymph nodes within the left pelvis. The right
pelvic adenopathy likely causes persistent partial obstruction of
the right ureter. In addition, there is asymmetric opacification of
the right external iliac vein, and central venous obstruction cannot
be excluded.

The stomach, small bowel and colon demonstrate no significant
findings. There is no evidence of bowel obstruction or focal
extraluminal fluid collection. A small supraumbilical hernia
containing only fat is stable. Aortoiliac atherosclerosis appears
stable. There is stable mild enlargement of the prostate gland. The
bladder appears normal.

There is asymmetric subcutaneous edema within the right flank and
right pelvis. Widespread blastic metastatic disease to the spine and
pelvis is again noted. There is slightly progressive asymmetric
Schmorl's node formation on the left involving the superior endplate
of L1 and the inferior endplate of L2, potentially pathologic. No
definite residual epidural tumor is evident in this area. However,
there is increased epidural tumor surrounding the right T10 pedicle
with extension into the lateral aspect of the right T10-11 foramen.
IMPRESSION: 1. Extensive metastatic disease to pelvic, retroperitoneal and
retrocrural lymph nodes as described. Disease within the mid
retroperitoneum has improved, suggesting interval radiation therapy.
However, the retrocrural and pelvic disease has worsened.
2. Grossly stable widespread osseous metastatic disease. Mild
progression and L1 and L2 fractures. Increased epidural tumor on the
right at T10-11.
3. Persistent probable partial obstruction of the right ureter.
4. Possible persistent extrinsic mass effect on the right iliac
vein.
5. New left lower lobe atelectasis.

## 2016-05-27 ENCOUNTER — Other Ambulatory Visit: Payer: Self-pay | Admitting: Nurse Practitioner
